# Patient Record
Sex: Male | Born: 1940 | Race: White | Hispanic: No | Marital: Married | Smoking: Former smoker
Health system: Southern US, Community
[De-identification: ages and names within clinical notes are randomized; demographics above are authoritative.]

## PROBLEM LIST (undated history)

## (undated) DIAGNOSIS — Z8679 Personal history of other diseases of the circulatory system: Secondary | ICD-10-CM

## (undated) DIAGNOSIS — C61 Malignant neoplasm of prostate: Secondary | ICD-10-CM

## (undated) DIAGNOSIS — I480 Paroxysmal atrial fibrillation: Secondary | ICD-10-CM

## (undated) DIAGNOSIS — I428 Other cardiomyopathies: Secondary | ICD-10-CM

## (undated) DIAGNOSIS — F0280 Dementia in other diseases classified elsewhere without behavioral disturbance: Secondary | ICD-10-CM

## (undated) DIAGNOSIS — J479 Bronchiectasis, uncomplicated: Secondary | ICD-10-CM

## (undated) DIAGNOSIS — E785 Hyperlipidemia, unspecified: Secondary | ICD-10-CM

## (undated) DIAGNOSIS — J189 Pneumonia, unspecified organism: Secondary | ICD-10-CM

## (undated) DIAGNOSIS — R0600 Dyspnea, unspecified: Secondary | ICD-10-CM

## (undated) DIAGNOSIS — I712 Thoracic aortic aneurysm, without rupture, unspecified: Secondary | ICD-10-CM

## (undated) DIAGNOSIS — F32A Depression, unspecified: Secondary | ICD-10-CM

## (undated) DIAGNOSIS — I5022 Chronic systolic (congestive) heart failure: Secondary | ICD-10-CM

## (undated) DIAGNOSIS — F419 Anxiety disorder, unspecified: Secondary | ICD-10-CM

## (undated) DIAGNOSIS — I429 Cardiomyopathy, unspecified: Secondary | ICD-10-CM

## (undated) DIAGNOSIS — I10 Essential (primary) hypertension: Secondary | ICD-10-CM

## (undated) DIAGNOSIS — I499 Cardiac arrhythmia, unspecified: Secondary | ICD-10-CM

## (undated) HISTORY — DX: Chronic systolic (congestive) heart failure: I50.22

## (undated) HISTORY — DX: Malignant neoplasm of prostate: C61

## (undated) HISTORY — PX: LOBECTOMY: SHX5089

## (undated) HISTORY — PX: KNEE ARTHROSCOPY: SUR90

## (undated) HISTORY — DX: Bronchiectasis, uncomplicated: J47.9

## (undated) HISTORY — DX: Hyperlipidemia, unspecified: E78.5

## (undated) HISTORY — DX: Dementia in other diseases classified elsewhere, unspecified severity, without behavioral disturbance, psychotic disturbance, mood disturbance, and anxiety: F02.80

## (undated) HISTORY — DX: Thoracic aortic aneurysm, without rupture, unspecified: I71.20

## (undated) HISTORY — PX: OTHER SURGICAL HISTORY: SHX169

## (undated) HISTORY — DX: Other cardiomyopathies: I42.8

## (undated) HISTORY — DX: Paroxysmal atrial fibrillation: I48.0

---

## 1998-01-21 ENCOUNTER — Encounter: Admission: RE | Admit: 1998-01-21 | Discharge: 1998-04-21 | Payer: Self-pay | Admitting: Radiation Oncology

## 1998-03-21 ENCOUNTER — Encounter: Payer: Self-pay | Admitting: Urology

## 1998-03-21 ENCOUNTER — Ambulatory Visit (HOSPITAL_BASED_OUTPATIENT_CLINIC_OR_DEPARTMENT_OTHER): Admission: RE | Admit: 1998-03-21 | Discharge: 1998-03-21 | Payer: Self-pay | Admitting: Urology

## 1998-04-11 ENCOUNTER — Encounter: Payer: Self-pay | Admitting: Radiation Oncology

## 1999-03-11 ENCOUNTER — Encounter: Admission: RE | Admit: 1999-03-11 | Discharge: 1999-06-09 | Payer: Self-pay | Admitting: Radiation Oncology

## 2001-08-14 ENCOUNTER — Encounter (INDEPENDENT_AMBULATORY_CARE_PROVIDER_SITE_OTHER): Payer: Self-pay | Admitting: Specialist

## 2001-08-14 ENCOUNTER — Ambulatory Visit (HOSPITAL_COMMUNITY): Admission: RE | Admit: 2001-08-14 | Discharge: 2001-08-14 | Payer: Self-pay | Admitting: Gastroenterology

## 2009-12-12 ENCOUNTER — Ambulatory Visit (HOSPITAL_COMMUNITY)
Admission: RE | Admit: 2009-12-12 | Discharge: 2009-12-12 | Payer: Self-pay | Source: Home / Self Care | Admitting: Urology

## 2010-05-29 NOTE — Op Note (Signed)
NAME:  Justin Contreras, Justin Contreras                           ACCOUNT NO.:  [id number]   MEDICAL RECORD NO.:  [id number]                   PATIENT TYPE:  AMB   LOCATION:  ENDO                                 FACILITY:  [facility]   PHYSICIAN:  Petra Kuba, M.D.                 DATE OF BIRTH:  [birth year]   DATE OF PROCEDURE:  08/14/2001  DATE OF DISCHARGE:                                 OPERATIVE REPORT   PROCEDURE:  Colonoscopy.   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   CONSENT:  Consent was signed after risks, benefits, methods, and options  were thoroughly discussed in the office.   MEDICATIONS USED:  Demerol 100, Versed 10.   DESCRIPTION OF PROCEDURE:  Rectal inspection pertinent for external  hemorrhoids, small. Digital examination was negative. The video regular  colonoscope was inserted, and immediately some localized radiation damage  was seen. This was friable. The scope was then advanced around the colon to  the cecum. Due to a tortuous looping colon, this required rolling him on his  back and some abdominal pressure. Upon insertion a small sigmoid polyp was  seen and was cold biopsied x1, and in the descending and transverse upon  insertion small polyps were seen and we elected to hot biopsy them. The  cecum was identified by the appendiceal orifice and the ileocecal valve. The  scope was slowly withdrawn. There were multiple tiny to small polyps on slow  withdrawal, all of which were hot biopsied and put with the descending and  transverse polyps. Two were in the ascending. Two additionally transversed  and two more in the descending. The polypectomy seen on insertion did not  have any obvious residual polypoid tissue. The prep was adequate. There was  some liquid stool that required washing and suctioning. As we redrew around  the sigmoid the polyp that was cold biopsied was seen, and this was hot  biopsied as well on withdrawal, and that  was put in the first container. No  other polypoid lesions were as we withdrew back to the rectum. The scope was  then retroflexed confirming the radiation proctitis and confirming some  small internal hemorrhoids. Based on the friability and __________ we went  ahead and used the Erbe argon plasma coagulator to the localized area. There  was very minimal radiation telangiectasias around the oozing area, so we  went ahead and used the Erbe until the oozing had stopped with a nice white  coagulum over the area. Bleeding could not be made to bleed with washing.  The scope was re-retroflexed which confirmed the above. The scope was  straightened, readvanced going up to the left side of the colon, air was  suctioned and the scope removed. The patient tolerated the procedure well  and there was no evidence of any complication.   ENDOSCOPIC DIAGNOSES:  1. Internal external  hemorrhoids.  2. Minimal radiation damage, mostly localized with some oozing, status post     Erbe argon plasma coagulator.  3. Multiple tiny small polyps, hot biopsied in all areas except for the     rectum and the cecum. We made addition cold biopsy of one polyp upon     insertion.  4. Otherwise within normal limits to the cecum.    PLAN:  Await pathology to determine future colonic screening. Followup  p.r.n. or in two months, recheck symptoms. Make sure no further aggressive  argon plasma coagulator therapy is needed, for which he is having to use the  suppositories p.r.n.                                               Petra Kuba, M.D.    MEM/MEDQ  D:  08/14/2001  T:  08/17/2001  Job:  [id number]   cc:   Wynn Banker, M.D.   [place] Shelle Iron., M.D.

## 2013-05-01 ENCOUNTER — Other Ambulatory Visit: Payer: Self-pay | Admitting: Urology

## 2013-05-01 DIAGNOSIS — C61 Malignant neoplasm of prostate: Secondary | ICD-10-CM

## 2013-05-07 ENCOUNTER — Encounter (HOSPITAL_COMMUNITY)
Admission: RE | Admit: 2013-05-07 | Discharge: 2013-05-07 | Disposition: A | Discharge status: Home / Self Care | Payer: Medicare Other | Source: Ambulatory Visit | Attending: Urology | Admitting: Urology

## 2013-05-07 DIAGNOSIS — C61 Malignant neoplasm of prostate: Secondary | ICD-10-CM

## 2013-05-07 MED ORDER — TECHNETIUM TC 99M MEDRONATE IV KIT
27.5000 | PACK | Freq: Once | INTRAVENOUS | Status: AC | PRN
  Administered 2013-05-07: 27.5 via INTRAVENOUS

## 2013-05-07 MED ORDER — TECHNETIUM TC 99M MEDRONATE IV KIT: 27.8000 | PACK | Freq: Once | INTRAVENOUS | Status: DC | PRN

## 2013-07-31 ENCOUNTER — Other Ambulatory Visit: Payer: Self-pay | Admitting: Gastroenterology

## 2015-03-07 DIAGNOSIS — H2513 Age-related nuclear cataract, bilateral: Secondary | ICD-10-CM | POA: Insufficient documentation

## 2015-03-07 DIAGNOSIS — R519 Headache, unspecified: Secondary | ICD-10-CM | POA: Insufficient documentation

## 2015-03-07 DIAGNOSIS — G453 Amaurosis fugax: Secondary | ICD-10-CM | POA: Insufficient documentation

## 2015-03-07 DIAGNOSIS — E782 Mixed hyperlipidemia: Secondary | ICD-10-CM | POA: Insufficient documentation

## 2015-03-07 DIAGNOSIS — E785 Hyperlipidemia, unspecified: Secondary | ICD-10-CM | POA: Insufficient documentation

## 2015-03-07 DIAGNOSIS — H524 Presbyopia: Secondary | ICD-10-CM | POA: Insufficient documentation

## 2015-03-07 DIAGNOSIS — H5203 Hypermetropia, bilateral: Secondary | ICD-10-CM | POA: Insufficient documentation

## 2015-03-07 DIAGNOSIS — H52221 Regular astigmatism, right eye: Secondary | ICD-10-CM | POA: Insufficient documentation

## 2016-07-05 DIAGNOSIS — F419 Anxiety disorder, unspecified: Secondary | ICD-10-CM | POA: Insufficient documentation

## 2016-07-05 DIAGNOSIS — Z8546 Personal history of malignant neoplasm of prostate: Secondary | ICD-10-CM | POA: Insufficient documentation

## 2019-06-07 DIAGNOSIS — H43813 Vitreous degeneration, bilateral: Secondary | ICD-10-CM | POA: Insufficient documentation

## 2019-08-22 DIAGNOSIS — M1811 Unilateral primary osteoarthritis of first carpometacarpal joint, right hand: Secondary | ICD-10-CM | POA: Insufficient documentation

## 2020-01-12 DIAGNOSIS — I480 Paroxysmal atrial fibrillation: Secondary | ICD-10-CM

## 2020-01-12 HISTORY — DX: Paroxysmal atrial fibrillation: I48.0

## 2020-03-11 DIAGNOSIS — J441 Chronic obstructive pulmonary disease with (acute) exacerbation: Secondary | ICD-10-CM | POA: Insufficient documentation

## 2020-08-14 ENCOUNTER — Other Ambulatory Visit (HOSPITAL_COMMUNITY): Payer: Self-pay | Admitting: Urology

## 2020-08-14 DIAGNOSIS — R9721 Rising PSA following treatment for malignant neoplasm of prostate: Secondary | ICD-10-CM

## 2020-08-14 DIAGNOSIS — C61 Malignant neoplasm of prostate: Secondary | ICD-10-CM

## 2020-09-01 ENCOUNTER — Other Ambulatory Visit: Payer: Self-pay

## 2020-09-01 ENCOUNTER — Ambulatory Visit (HOSPITAL_COMMUNITY)
Admission: RE | Admit: 2020-09-01 | Discharge: 2020-09-01 | Disposition: A | Discharge status: Home / Self Care | Payer: Medicare Other | Source: Ambulatory Visit | Attending: Urology | Admitting: Urology

## 2020-09-01 DIAGNOSIS — R9721 Rising PSA following treatment for malignant neoplasm of prostate: Secondary | ICD-10-CM | POA: Diagnosis present

## 2020-09-01 DIAGNOSIS — C61 Malignant neoplasm of prostate: Secondary | ICD-10-CM | POA: Diagnosis present

## 2020-09-01 MED ORDER — PIFLIFOLASTAT F 18 (PYLARIFY) INJECTION
9.0000 | Freq: Once | INTRAVENOUS | Status: AC
  Administered 2020-09-01: 9.13 via INTRAVENOUS

## 2020-09-11 ENCOUNTER — Other Ambulatory Visit: Payer: Self-pay | Admitting: Urology

## 2020-09-11 ENCOUNTER — Other Ambulatory Visit (HOSPITAL_COMMUNITY): Payer: Self-pay | Admitting: Urology

## 2020-09-11 DIAGNOSIS — I712 Thoracic aortic aneurysm, without rupture, unspecified: Secondary | ICD-10-CM

## 2020-09-18 ENCOUNTER — Encounter (HOSPITAL_COMMUNITY): Payer: Self-pay

## 2020-09-18 ENCOUNTER — Ambulatory Visit (HOSPITAL_COMMUNITY): Payer: Medicare Other

## 2020-09-19 ENCOUNTER — Other Ambulatory Visit: Payer: Self-pay

## 2020-09-19 ENCOUNTER — Ambulatory Visit (HOSPITAL_COMMUNITY)
Admission: RE | Admit: 2020-09-19 | Discharge: 2020-09-19 | Disposition: A | Discharge status: Home / Self Care | Payer: Medicare Other | Source: Ambulatory Visit | Attending: Urology | Admitting: Urology

## 2020-09-19 DIAGNOSIS — I712 Thoracic aortic aneurysm, without rupture, unspecified: Secondary | ICD-10-CM

## 2020-09-19 MED ORDER — IOHEXOL 350 MG/ML SOLN
80.0000 mL | Freq: Once | INTRAVENOUS | Status: AC | PRN
  Administered 2020-09-19: 80 mL via INTRAVENOUS

## 2020-10-01 ENCOUNTER — Other Ambulatory Visit: Payer: Self-pay

## 2020-10-01 ENCOUNTER — Institutional Professional Consult (permissible substitution) (INDEPENDENT_AMBULATORY_CARE_PROVIDER_SITE_OTHER): Payer: Medicare Other | Admitting: Thoracic Surgery (Cardiothoracic Vascular Surgery)

## 2020-10-01 ENCOUNTER — Encounter: Payer: Self-pay | Admitting: Thoracic Surgery (Cardiothoracic Vascular Surgery)

## 2020-10-01 VITALS — BP 152/76 | HR 81 | Resp 20 | Ht 74.0 in | Wt 175.0 lb

## 2020-10-01 DIAGNOSIS — I712 Thoracic aortic aneurysm, without rupture: Secondary | ICD-10-CM | POA: Diagnosis not present

## 2020-10-01 DIAGNOSIS — I7121 Aneurysm of the ascending aorta, without rupture: Secondary | ICD-10-CM

## 2020-10-01 NOTE — Progress Notes (Signed)
PCP is Derrill Center., MD Referring Provider is Franchot Gallo, MD  Chief Complaint  Patient presents with   Thoracic Aortic Aneurysm    Surgical consult, CTA Chest 09/19/20     HPI: [name] sent for consultation regarding an ascending aneurysm.  Justin Contreras is a 80 year old man with a history of prostate cancer (now with recurrence and left lower lobectomy for bronchiectasis.  He denies any previous cardiac history, heart murmur, hypertension, or hyperlipidemia.  He recently was found to have an elevated PSA.  PET/CT showed recurrence of his prostate cancer.  Also noted a 5 cm ascending aneurysm.  A CT of the chest abdomen pelvis showed the aneurysm was in fact about 5.6 cm in diameter.  The aorta returns to normal caliber just before the left subclavian.  He says has been feeling short of breath dating back to about March or April.  He said decreased energy also complains of weight loss.  In addition he is also had problems with memory recently.  He is very concerned about that.  No chest pain, pressure, or tightness.  Denies peripheral edema.  History reviewed. No pertinent past medical history.  Past Surgical History:  Procedure Laterality Date   KNEE ARTHROSCOPY Left    2014   [facility] micrographic surgery nose     partial remove lung     2008   prostate surgery/transperinal placement of needles      Family History  Problem Relation Age of Onset   Parkinson's disease Mother    Cancer Father    Cancer - Prostate Father     Social History    Current Outpatient Medications  Medication Sig Dispense Refill   albuterol (VENTOLIN HFA) 108 (90 Base) MCG/ACT inhaler Inhale 2 puffs into the lungs every 6 (six) hours as needed. (Patient not taking: Reported on 10/01/2020)     amitriptyline (ELAVIL) 25 MG tablet  (Patient not taking: Reported on 10/01/2020)     citalopram (CELEXA) 20 MG tablet  (Patient not taking: Reported on 10/01/2020)     hyoscyamine (LEVSIN) 0.125 MG tablet  Take by mouth. (Patient not taking: Reported on 10/01/2020)     ibuprofen (ADVIL) 200 MG tablet Take by mouth. (Patient not taking: Reported on 10/01/2020)     meclizine (ANTIVERT) 25 MG tablet Take by mouth. (Patient not taking: Reported on 10/01/2020)     No current facility-administered medications for this visit.    Allergies  Allergen Reactions   Amoxicillin Other (See Comments)    Bad taste in mouth & tongue gets fuzzy feeling   Pravastatin Other (See Comments)    Oral dryness and bad ttaste Bad taste in mouth & tongue gets fuzzy feeling    Simvastatin Other (See Comments)    Oral problems and bad taste Fatigue    Statins     Review of Systems  Constitutional:  Positive for fatigue.  HENT:  Negative for trouble swallowing and voice change.   Respiratory:  Positive for shortness of breath. Negative for wheezing.   Cardiovascular:  Negative for chest pain and leg swelling.  Genitourinary:  Positive for frequency. Negative for dysuria.  Neurological:  Negative for seizures, syncope and weakness.  Hematological:  Negative for adenopathy. Does not bruise/bleed easily.   BP (!) 152/76 (BP Location: Right Arm, Patient Position: Sitting)   Pulse 81   Resp 20   Ht 6\' 2"  (1.88 m)   Wt 175 lb (79.4 kg)   SpO2 94% Comment: RA  BMI 22.47  kg/m  Physical Exam Vitals reviewed.  Constitutional:      General: He is not in acute distress.    Appearance: Normal appearance.  HENT:     Head: Normocephalic and atraumatic.  Eyes:     General: No scleral icterus.    Extraocular Movements: Extraocular movements intact.  Neck:     Vascular: No carotid bruit.  Cardiovascular:     Rate and Rhythm: Normal rate and regular rhythm.     Heart sounds: Murmur (Faint diastolic murmur) heard.    No gallop.  Pulmonary:     Effort: Pulmonary effort is normal. No respiratory distress.     Breath sounds: Normal breath sounds. No wheezing or rales.  Abdominal:     General: There is no distension.      Palpations: Abdomen is soft.  Musculoskeletal:     Cervical back: Neck supple.  Skin:    General: Skin is warm and dry.  Neurological:     General: No focal deficit present.     Mental Status: He is alert and oriented to person, place, and time.     Cranial Nerves: No cranial nerve deficit.     Motor: No weakness.     Diagnostic Tests: CT CHEST FINDINGS   Vascular Findings:   Fusiform aneurysmal dilatation involving the proximal ascending thoracic aorta with measurements as follows. The ascending thoracic aorta remains aneurysmal through the level of the main pulmonary artery though tapers to a normal caliber at the level of the aortic arch.   There is a moderate amount of mixed calcified and noncalcified atherosclerotic plaque involving the aortic arch, not resulting in hemodynamically significant stenosis. The descending thoracic aorta is of normal caliber and widely patent without a hemodynamically significant stenosis.   Review of the precontrast images is negative for the presence an intramural hematoma. No evidence of thoracic aortic dissection or periaortic stranding on this non gated examination.   Conventional configuration of the aortic arch. The branch vessels of the aortic arch appear widely patent throughout their imaged courses.   Cardiomegaly. Coronary artery calcifications. No pericardial effusion though a small amount of fluid is seen within the pericardial recess.   Although this examination was not tailored for the evaluation the pulmonary arteries, there are no discrete filling defects within the central pulmonary arterial tree to suggest central pulmonary embolism. Normal caliber of the main pulmonary artery.   -------------------------------------------------------------   Thoracic aortic measurements:   Sinotubular junction   44 mm as measured in greatest oblique short axis coronal dimension.   Proximal ascending aorta   58 mm as  measured in greatest oblique short axis axial diameter (axial image 192, series 8); 55 mm in greatest oblique short axis coronal diameter (coronal image 50, series 18). The ascending thoracic aorta remains aneurysmal through the level of the main pulmonary artery measuring 15 mm (axial image 165, series 8).   Aortic arch aorta   30 mm as measured in greatest oblique short axis sagittal dimension.   Proximal descending thoracic aorta   31 mm as measured in greatest oblique short axis axial dimension at the level of the main pulmonary artery.   Distal descending thoracic aorta   20 mm as measured in greatest oblique short axis axial dimension at the level of the diaphragmatic hiatus.   Review of the MIP images confirms the above findings.   -------------------------------------------------------------   Non-Vascular Findings:   Mediastinum/Lymph Nodes: No bulky mediastinal, hilar or axillary lymphadenopathy.   Lungs/Pleura: Redemonstrated  pleural calcifications with associated adjacent pleuroparenchymal thickening, most conspicuously involving the right hemidiaphragm (coronal image 78, series 19), likely the sequela previous asbestos exposure. No evidence of fibrosis.   Minimal biapical pleuroparenchymal thickening. No discrete focal airspace opacities. No pleural effusion or pneumothorax. The central pulmonary airways appear widely patent.   Note is made of a approximately 0.4 cm right middle lobe pulmonary nodule (image 92, series 12) as well as an approximately 0.3 cm right lower lobe pulmonary nodule (coronal image 92, series 19).   Note is also made of several scattered perifissural lymph nodes about the right major fissure (images 109 and 125, series 12).   Musculoskeletal: No acute or aggressive osseous abnormalities. 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 Regional soft tissues appear normal. Normal appearance of the thyroid gland.    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  The aorta returns to normal between the takeoff of the left common carotid and the left subclavian arteries.  Impression: Justin Contreras is a 80 year old man with a history of prostate cancer and a left lower lobectomy for bronchiectasis at age 84.  He has been generally healthy prior to his diagnosis of prostate cancer.  No history of hypertension, hyperlipidemia, or atherosclerotic cardiovascular disease.  Recently had a biochemical recurrence of prostate cancer.  A PET/CT was done which confirmed that recurrence.  He also was found to have an ascending aneurysm.  CT of the chest with contrast showed a 5.7 cm ascending aneurysm.  About 4.4 cm at the sinotubular junction.  Ascending aortic aneurysm/thoracic aortic atherosclerosis-I reviewed the images with Mr. and Mrs. Costanzo.  We discussed indication for surgery.  Generally speaking when the aorta is greater than 5.5 cm the risk of dissection and/or rupture increases dramatically and there is indication for surgery.  He would need further work-up prior to surgical repair.  Timing of intervention is complicated in his case due to his recurrent prostate cancer.  Recurrent prostate cancer.  Seeing Dr. Diona Fanti.  Patient says he was told he might be a candidate for cryoablation and if so they can proceed with that without any issue from my standpoint.  A little more complicated if he needed any major surgical resection.  No history of hypertension.  Blood pressure slightly elevated today.  Not unusual first time seeing a Psychologist, sport and exercise.  We will recheck him when he returns in a couple of weeks.  Likely will need medication.  He does have a faint diastolic murmur.  I would be surprised if he has some aortic insufficiency.   We will get a 2D echo to assess that.  He does need cardiac catheterization to further assess his coronary disease prior to possible surgical repair.  Plan: 2D echo to assess aortic valve and LV function Cardiology consultation for catheterization Return in 2 to 3 weeks to discuss results and decide on whether to proceed with surgical resection.  Melrose Nakayama, MD [facility] [phone]

## 2020-10-02 ENCOUNTER — Other Ambulatory Visit: Payer: Self-pay | Admitting: *Deleted

## 2020-10-02 DIAGNOSIS — Z01818 Encounter for other preprocedural examination: Secondary | ICD-10-CM

## 2020-10-02 DIAGNOSIS — R0600 Dyspnea, unspecified: Secondary | ICD-10-CM

## 2020-10-02 DIAGNOSIS — R011 Cardiac murmur, unspecified: Secondary | ICD-10-CM

## 2020-10-06 ENCOUNTER — Ambulatory Visit (HOSPITAL_BASED_OUTPATIENT_CLINIC_OR_DEPARTMENT_OTHER)
Admission: RE | Admit: 2020-10-06 | Discharge: 2020-10-06 | Disposition: A | Discharge status: Home / Self Care | Payer: Medicare Other | Source: Ambulatory Visit | Attending: Thoracic Surgery (Cardiothoracic Vascular Surgery) | Admitting: Thoracic Surgery (Cardiothoracic Vascular Surgery)

## 2020-10-06 ENCOUNTER — Other Ambulatory Visit: Payer: Self-pay

## 2020-10-06 DIAGNOSIS — R0609 Other forms of dyspnea: Secondary | ICD-10-CM

## 2020-10-06 DIAGNOSIS — Z01818 Encounter for other preprocedural examination: Secondary | ICD-10-CM | POA: Insufficient documentation

## 2020-10-06 DIAGNOSIS — R0600 Dyspnea, unspecified: Secondary | ICD-10-CM | POA: Insufficient documentation

## 2020-10-06 DIAGNOSIS — R011 Cardiac murmur, unspecified: Secondary | ICD-10-CM | POA: Diagnosis present

## 2020-10-06 LAB — ECHOCARDIOGRAM COMPLETE
AR max vel: 3.27 cm2
AV Area VTI: 2.94 cm2
AV Area mean vel: 3.31 cm2
AV Mean grad: 6 mmHg
AV Peak grad: 11.6 mmHg
Ao pk vel: 1.7 m/s
Area-P 1/2: 2 cm2
Calc EF: 48.5 %
P 1/2 time: 578 msec
S' Lateral: 4.3 cm
Single Plane A2C EF: 52 %
Single Plane A4C EF: 46.8 %

## 2020-10-09 ENCOUNTER — Telehealth: Payer: Self-pay

## 2020-10-09 NOTE — Telephone Encounter (Signed)
Patient contacted the office confused about future appointments. He was unsure if he should keep new appointment with Cardiology office because of the drive from [facility]. He stated that "this is the soonest they could get me in". He wasn't sure that he would be able to continue follow-up after surgery with Cardiology in [place]. Advised that he could come to [facility] for future appointments after surgery. Patient seemed somewhat better and like he understood but still confused and spoke extremely slow. He seemed like he knew what he wanted to say but could not get the words out. It was mentioned in his note with Dr. Roxan Hockey that he was concerned about memory loss. Patient's wife was not available at home when patient was called.

## 2020-10-16 ENCOUNTER — Encounter: Payer: Self-pay | Admitting: Internal Medicine

## 2020-10-16 ENCOUNTER — Other Ambulatory Visit: Payer: Self-pay

## 2020-10-16 ENCOUNTER — Ambulatory Visit (INDEPENDENT_AMBULATORY_CARE_PROVIDER_SITE_OTHER): Payer: Medicare Other | Admitting: Internal Medicine

## 2020-10-16 VITALS — BP 160/68 | HR 93 | Ht 74.0 in | Wt 154.5 lb

## 2020-10-16 DIAGNOSIS — E785 Hyperlipidemia, unspecified: Secondary | ICD-10-CM | POA: Diagnosis not present

## 2020-10-16 DIAGNOSIS — I719 Aortic aneurysm of unspecified site, without rupture: Secondary | ICD-10-CM

## 2020-10-16 DIAGNOSIS — I5021 Acute systolic (congestive) heart failure: Secondary | ICD-10-CM

## 2020-10-16 DIAGNOSIS — C61 Malignant neoplasm of prostate: Secondary | ICD-10-CM

## 2020-10-16 MED ORDER — CARVEDILOL 3.125 MG PO TABS: 3.1250 mg | ORAL_TABLET | Freq: Two times a day (BID) | ORAL | 0 refills | Status: DC

## 2020-10-16 MED ORDER — LISINOPRIL 5 MG PO TABS: 5.0000 mg | ORAL_TABLET | Freq: Every day | ORAL | 0 refills | Status: DC

## 2020-10-16 MED ORDER — ASPIRIN EC 81 MG PO TBEC: 81.0000 mg | DELAYED_RELEASE_TABLET | Freq: Every day | ORAL | 0 refills | Status: DC

## 2020-10-16 NOTE — Patient Instructions (Signed)
Medication Instructions:   Your physician has recommended you make the following change in your medication:    START taking Aspirin 81 MG.  2.    START taking Carvedilol (Coreg) 3.25 twice a day.  3.    START taking Lisinopril 5 MG once a day.  *If you need a refill on your cardiac medications before your next appointment, please call your pharmacy*   Lab Work:  CBC, BMP to be drawn today.  Testing/Procedures:   You are scheduled for a Cardiac Catheterization on Monday, October 10 with Dr. Harrell Gave End.  1. Please arrive at the [facility] (Main Entrance A) at [facility]: [place], [place] [address] at 5:30 AM (This time is two hours before your procedure to ensure your preparation). Free valet parking service is available.   Special note: Every effort is made to have your procedure done on time. Please understand that emergencies sometimes delay scheduled procedures.  2. Diet: Do not eat solid foods after midnight.  The patient may have clear liquids until 5am upon the day of the procedure.  3. Labs: Drawn in office  4. Medication instructions in preparation for your procedure:   Contrast Allergy: No   On the morning of your procedure, take your Aspirin and any morning medicines NOT listed above.  You may use sips of water.  5. Plan for one night stay--bring personal belongings. 6. Bring a current list of your medications and current insurance cards. 7. You MUST have a responsible person to drive you home. 8. Someone MUST be with you the first 24 hours after you arrive home or your discharge will be delayed. 9. Please wear clothes that are easy to get on and off and wear slip-on shoes.  Thank you for allowing [place] to care for you!   -- [facility]    Follow-Up: At [facility], you and your health needs are our priority.  As part of our continuing mission to provide you with exceptional heart care, we have  created designated Provider Care Teams.  These Care Teams include your primary Cardiologist (physician) and Advanced Practice Providers (APPs -  Physician Assistants and Nurse Practitioners) who all work together to provide you with the care you need, when you need it.  We recommend signing up for the patient portal called "MyChart".  Sign up information is provided on this After Visit Summary.  MyChart is used to connect with patients for Virtual Visits (Telemedicine).  Patients are able to view lab/test results, encounter notes, upcoming appointments, etc.  Non-urgent messages can be sent to your provider as well.   To learn more about what you can do with MyChart, go to [email or web address].    Your next appointment:   2 week(s)  The format for your next appointment:   In Person  Provider:   You may see Dr. Saunders Revel  or one of the following Advanced Practice Providers on your designated Care Team:   Murray Hodgkins, NP Christell Faith, PA-C Marrianne Mood, PA-C Cadence Kathlen Mody, [place]   Other Instructions

## 2020-10-16 NOTE — Progress Notes (Signed)
New Outpatient Visit Date: 10/16/2020  Referring Provider: Melrose Nakayama, MD [address] [place],  [place] [address]  Chief Complaint: Dyspnea on exertion and thoracic aortic aneurysm  HPI:  Justin Contreras is a 80 y.o. male who is being seen today for the evaluation of dyspnea on exertion and thoracic aortic aneurysm at the request of Dr. Roxan Hockey. He has a history of recurrent prostate cancer followed by Dr. Diona Fanti as well as recently diagnosed thoracic aortic aneurysm, hyperlipidemia (statin intolerant), and recurrent lung infections attributed to bronchiectasis status post remote left lung resection.  Follow-up of his prostate cancer recently revealed elevated PSA, prompting referral for PET/CT.  This demonstrated a 5 cm ascending aortic aneurysm, with follow-up CTA showing aneurysm measuring up to 5.6 cm in diameter.  He was referred to Dr. Roxan Hockey for consideration of surgical repair.  Subsequent echo demonstrated mildly-moderately reduced left ventricular systolic function (EF 93%) with mild LVH and global hypokinesis.  Mild mitral and aortic valve regurgitation were noted.  Ascending aorta and aortic root were again noted to be severely enlarged.  Today, Mr. Justin Contreras reports that he has been having shortness of breath with modest activity over the last few months.  He also feels like he has been more forgetful over the last 4 months, though his family has noted some memory issues for the last 1.5 years.  He denies chest pain, palpitations, lightheadedness, edema, and orthopnea.  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  Mildly-moderately reduced systolic function with LVEF of 45%.  There is global hypokinesis with grade 1 diastolic dysfunction.  Normal RV size and function.  Normal biatrial size.  Mild mitral and aortic valve regurgitation.  Severely dilated aortic root and ascending aorta, measuring 5.5 cm.  Elevated central venous pressure. CTA chest, abdomen, and pelvis (09/19/2020): Fusiform aneurysmal dilation of the ascending aorta measuring up to 5.8 cm, coronary artery calcification, pleural calcifications, and indeterminate punctate right-sided pulmonary nodules.  Aortic atherosclerosis, indeterminate hypoattenuating lesion in the tail of the pancreas, and suspected localized recurrence of cancer in the prostate bed.  --------------------------------------------------------------------------------------------------  Past Medical History:  Diagnosis Date   Prostate cancer [facility])     Past Surgical History:  Procedure Laterality Date   KNEE ARTHROSCOPY Left    2014   mohs micrographic surgery nose     partial remove lung     2008   prostate surgery/transperinal placement of needles      Current Meds  Medication Sig   aspirin EC 81 MG tablet Take 1 tablet (81 mg total) by mouth daily. Swallow whole.   carvedilol (COREG) 3.125 MG tablet Take 1 tablet (3.125 mg total) by mouth 2 (two) times daily.   lisinopril (ZESTRIL) 5 MG tablet Take 1 tablet (5 mg total) by mouth daily.    Allergies: Amoxicillin, Pravastatin, Simvastatin, and Statins  Social History   Tobacco Use   Smoking status: Former    Years: 10.00    Types: Cigarettes   Smokeless tobacco: Never  Vaping Use   Vaping Use: Never used  Substance Use Topics   Alcohol use: Yes    Alcohol/week: 1.0 standard drink  Types: 1 Glasses of wine  per week   Drug use: Never    Family History  Problem Relation Age of Onset   Parkinson's disease Mother    Cancer Father    Cancer - Prostate Father     Review of Systems: A 12-system review of systems was performed and was negative except as noted in the HPI.  --------------------------------------------------------------------------------------------------  Physical Exam: BP (!) 160/68 (BP Location: Right Arm, Patient Position: Sitting, Cuff Size: Normal)   Pulse 93   Ht 6\' 2"  (1.88 m)   Wt 154 lb 8 oz (70.1 kg)   SpO2 97%   BMI 19.84 kg/m   General: NAD.  Accompanied by wife and son. HEENT: No conjunctival pallor or scleral icterus. Facemask in place. Neck: Supple without lymphadenopathy, thyromegaly, JVD, or HJR. No carotid bruit. Lungs: Normal work of breathing.  Mildly diminished breath sounds throughout without wheezes or crackles. Heart: Regular rate and rhythm with 1/6 systolic and early diastolic murmurs.  No rubs or gallops.  Nondisplaced PMI. Abd: Bowel sounds present. Soft, NT/ND without hepatosplenomegaly Ext: No lower extremity edema. Radial, PT, and DP pulses are 2+ bilaterally Skin: Warm and dry without rash. Neuro: CNIII-XII intact. Strength and fine-touch sensation intact in upper and lower extremities bilaterally. Psych: Normal mood and affect.  EKG: Normal sinus rhythm with first-degree AV block (PR interval 224 ms) and left bundle branch block.  No prior tracing available for comparison.  Lab Results  Component Value Date   WBC 5.1 10/16/2020   HGB 15.2 10/16/2020   HCT 45.2 10/16/2020   MCV 90 10/16/2020   PLT 177 10/16/2020    Lab Results  Component Value Date   NA 142 10/16/2020   K 4.2 10/16/2020   CL 104 10/16/2020   CO2 21 10/16/2020   BUN 20 10/16/2020   CREATININE 1.15 10/16/2020   GLUCOSE 133 (H) 10/16/2020   --------------------------------------------------------------------------------------------------  ASSESSMENT AND  PLAN: Thoracic aortic aneurysm: Incidentally noted during work-up of recurrent prostate cancer.  Thoracic aorta is severely dilated and at elevated risk for dissection/rupture.  This will likely need to be repaired in the near future.  We will move forward with right and left heart catheterization next week to further evaluate cardiomyopathy and exertional dyspnea as well as to assist with surgical planning for TAA repair.  We will add carvedilol and lisinopril, as detailed below, as well as aspirin 81 mg daily.  Acute HFrEF: Mr. Schumpert reports fairly recent onset of exertional dyspnea and was noted to have mildly-moderately reduced LVEF on recent echo.  He appears euvolemic on exam with NYHA class III symptoms.  His degree of aortic and mitral regurgitation would not explain his cardiomyopathy.  We have agreed to proceed with right and left heart catheterization, as outlined above.  Even if significant underlying CAD is identified, we would defer PCI given need for TAA repair and potential for bypass to be performed at the time of surgery.  In the meantime, we will add carvedilol 3.125 mg twice daily (PR interval will need to be followed closely) and lisinopril 2.5 mg daily.  Hypertension: Blood pressure moderately elevated today.  Elevated blood pressure was also noted during recent visit with Dr. Roxan Hockey.  Though Mr. Talarico does not carry a diagnosis of hypertension, certainly his readings are consistent with this diagnosis.  In the setting of cardiomyopathy and TAA, aggressive blood pressure control is warranted.  As above, we will start with low-dose carvedilol and lisinopril, with further up titration  as needed.  Hyperlipidemia: No recent lipid panel available.  We will plan to draw fasting lipid panel at the time of catheterization next week.  Based on results, we may need to rechallenge him with a statin (intolerant of pravastatin and simvastatin) or consider an alternative agent such as PCSK9  inhibitor or bempedoic acid.  Prostate cancer: Ongoing work-up per Dr. Diona Fanti.  Mr. Demeo reports that he may need to undergo cryoablation versus hormonal therapy.  We will defer further recommendations regarding perioperative risk until after catheterization and further consultation with Dr. Roxan Hockey.  Shared Decision Making/Informed Consent The risks [stroke (1 in 1000), death (1 in 1000), kidney failure [usually temporary] (1 in 500), bleeding (1 in 200), allergic reaction [possibly serious] (1 in 200)], benefits (diagnostic support and management of coronary artery disease) and alternatives of a cardiac catheterization were discussed in detail with Mr. Powley and he is willing to proceed.  Follow-up: Return to clinic in 2-3 weeks.  Nelva Bush, MD 10/17/2020 7:37 AM

## 2020-10-16 NOTE — H&P (View-Only) (Signed)
New Outpatient Visit Date: 10/16/2020  Referring Provider: Melrose Nakayama, MD [address] [place],  [place] [address]  Chief Complaint: Dyspnea on exertion and thoracic aortic aneurysm  HPI:  Justin Contreras is a 80 y.o. male who is being seen today for the evaluation of dyspnea on exertion and thoracic aortic aneurysm at the request of Dr. Roxan Hockey. He has a history of recurrent prostate cancer followed by Dr. Diona Fanti as well as recently diagnosed thoracic aortic aneurysm, hyperlipidemia (statin intolerant), and recurrent lung infections attributed to bronchiectasis status post remote left lung resection.  Follow-up of his prostate cancer recently revealed elevated PSA, prompting referral for PET/CT.  This demonstrated a 5 cm ascending aortic aneurysm, with follow-up CTA showing aneurysm measuring up to 5.6 cm in diameter.  He was referred to Dr. Roxan Hockey for consideration of surgical repair.  Subsequent echo demonstrated mildly-moderately reduced left ventricular systolic function (EF 91%) with mild LVH and global hypokinesis.  Mild mitral and aortic valve regurgitation were noted.  Ascending aorta and aortic root were again noted to be severely enlarged.  Today, Justin Contreras reports that he has been having shortness of breath with modest activity over the last few months.  He also feels like he has been more forgetful over the last 4 months, though his family has noted some memory issues for the last 1.5 years.  He denies chest pain, palpitations, lightheadedness, edema, and orthopnea.  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  Mildly-moderately reduced systolic function with LVEF of 45%.  There is global hypokinesis with grade 1 diastolic dysfunction.  Normal RV size and function.  Normal biatrial size.  Mild mitral and aortic valve regurgitation.  Severely dilated aortic root and ascending aorta, measuring 5.5 cm.  Elevated central venous pressure. CTA chest, abdomen, and pelvis (09/19/2020): Fusiform aneurysmal dilation of the ascending aorta measuring up to 5.8 cm, coronary artery calcification, pleural calcifications, and indeterminate punctate right-sided pulmonary nodules.  Aortic atherosclerosis, indeterminate hypoattenuating lesion in the tail of the pancreas, and suspected localized recurrence of cancer in the prostate bed.  --------------------------------------------------------------------------------------------------  Past Medical History:  Diagnosis Date   Prostate cancer [facility])     Past Surgical History:  Procedure Laterality Date   KNEE ARTHROSCOPY Left    2014   mohs micrographic surgery nose     partial remove lung     2008   prostate surgery/transperinal placement of needles      Current Meds  Medication Sig   aspirin EC 81 MG tablet Take 1 tablet (81 mg total) by mouth daily. Swallow whole.   carvedilol (COREG) 3.125 MG tablet Take 1 tablet (3.125 mg total) by mouth 2 (two) times daily.   lisinopril (ZESTRIL) 5 MG tablet Take 1 tablet (5 mg total) by mouth daily.    Allergies: Amoxicillin, Pravastatin, Simvastatin, and Statins  Social History   Tobacco Use   Smoking status: Former    Years: 10.00    Types: Cigarettes   Smokeless tobacco: Never  Vaping Use   Vaping Use: Never used  Substance Use Topics   Alcohol use: Yes    Alcohol/week: 1.0 standard drink  Types: 1 Glasses of wine  per week   Drug use: Never    Family History  Problem Relation Age of Onset   Parkinson's disease Mother    Cancer Father    Cancer - Prostate Father     Review of Systems: A 12-system review of systems was performed and was negative except as noted in the HPI.  --------------------------------------------------------------------------------------------------  Physical Exam: BP (!) 160/68 (BP Location: Right Arm, Patient Position: Sitting, Cuff Size: Normal)   Pulse 93   Ht 6\' 2"  (1.88 m)   Wt 154 lb 8 oz (70.1 kg)   SpO2 97%   BMI 19.84 kg/m   General: NAD.  Accompanied by wife and son. HEENT: No conjunctival pallor or scleral icterus. Facemask in place. Neck: Supple without lymphadenopathy, thyromegaly, JVD, or HJR. No carotid bruit. Lungs: Normal work of breathing.  Mildly diminished breath sounds throughout without wheezes or crackles. Heart: Regular rate and rhythm with 1/6 systolic and early diastolic murmurs.  No rubs or gallops.  Nondisplaced PMI. Abd: Bowel sounds present. Soft, NT/ND without hepatosplenomegaly Ext: No lower extremity edema. Radial, PT, and DP pulses are 2+ bilaterally Skin: Warm and dry without rash. Neuro: CNIII-XII intact. Strength and fine-touch sensation intact in upper and lower extremities bilaterally. Psych: Normal mood and affect.  EKG: Normal sinus rhythm with first-degree AV block (PR interval 224 ms) and left bundle branch block.  No prior tracing available for comparison.  Lab Results  Component Value Date   WBC 5.1 10/16/2020   HGB 15.2 10/16/2020   HCT 45.2 10/16/2020   MCV 90 10/16/2020   PLT 177 10/16/2020    Lab Results  Component Value Date   NA 142 10/16/2020   K 4.2 10/16/2020   CL 104 10/16/2020   CO2 21 10/16/2020   BUN 20 10/16/2020   CREATININE 1.15 10/16/2020   GLUCOSE 133 (H) 10/16/2020   --------------------------------------------------------------------------------------------------  ASSESSMENT AND  PLAN: Thoracic aortic aneurysm: Incidentally noted during work-up of recurrent prostate cancer.  Thoracic aorta is severely dilated and at elevated risk for dissection/rupture.  This will likely need to be repaired in the near future.  We will move forward with right and left heart catheterization next week to further evaluate cardiomyopathy and exertional dyspnea as well as to assist with surgical planning for TAA repair.  We will add carvedilol and lisinopril, as detailed below, as well as aspirin 81 mg daily.  Acute HFrEF: Justin Contreras reports fairly recent onset of exertional dyspnea and was noted to have mildly-moderately reduced LVEF on recent echo.  He appears euvolemic on exam with NYHA class III symptoms.  His degree of aortic and mitral regurgitation would not explain his cardiomyopathy.  We have agreed to proceed with right and left heart catheterization, as outlined above.  Even if significant underlying CAD is identified, we would defer PCI given need for TAA repair and potential for bypass to be performed at the time of surgery.  In the meantime, we will add carvedilol 3.125 mg twice daily (PR interval will need to be followed closely) and lisinopril 2.5 mg daily.  Hypertension: Blood pressure moderately elevated today.  Elevated blood pressure was also noted during recent visit with Dr. Roxan Hockey.  Though Justin Contreras does not carry a diagnosis of hypertension, certainly his readings are consistent with this diagnosis.  In the setting of cardiomyopathy and TAA, aggressive blood pressure control is warranted.  As above, we will start with low-dose carvedilol and lisinopril, with further up titration  as needed.  Hyperlipidemia: No recent lipid panel available.  We will plan to draw fasting lipid panel at the time of catheterization next week.  Based on results, we may need to rechallenge him with a statin (intolerant of pravastatin and simvastatin) or consider an alternative agent such as PCSK9  inhibitor or bempedoic acid.  Prostate cancer: Ongoing work-up per Dr. Diona Fanti.  Justin Contreras reports that he may need to undergo cryoablation versus hormonal therapy.  We will defer further recommendations regarding perioperative risk until after catheterization and further consultation with Dr. Roxan Hockey.  Shared Decision Making/Informed Consent The risks [stroke (1 in 1000), death (1 in 1000), kidney failure [usually temporary] (1 in 500), bleeding (1 in 200), allergic reaction [possibly serious] (1 in 200)], benefits (diagnostic support and management of coronary artery disease) and alternatives of a cardiac catheterization were discussed in detail with Justin Contreras and he is willing to proceed.  Follow-up: Return to clinic in 2-3 weeks.  Nelva Bush, MD 10/17/2020 7:37 AM

## 2020-10-17 ENCOUNTER — Encounter: Payer: Self-pay | Admitting: Internal Medicine

## 2020-10-17 DIAGNOSIS — C61 Malignant neoplasm of prostate: Secondary | ICD-10-CM | POA: Insufficient documentation

## 2020-10-17 DIAGNOSIS — I719 Aortic aneurysm of unspecified site, without rupture: Secondary | ICD-10-CM | POA: Insufficient documentation

## 2020-10-17 DIAGNOSIS — I5021 Acute systolic (congestive) heart failure: Secondary | ICD-10-CM | POA: Insufficient documentation

## 2020-10-17 LAB — BASIC METABOLIC PANEL
BUN/Creatinine Ratio: 17 (ref 10–24)
BUN: 20 mg/dL (ref 8–27)
CO2: 21 mmol/L (ref 20–29)
Calcium: 9.2 mg/dL (ref 8.6–10.2)
Chloride: 104 mmol/L (ref 96–106)
Creatinine, Ser: 1.15 mg/dL (ref 0.76–1.27)
Glucose: 133 mg/dL — ABNORMAL HIGH (ref 70–99)
Potassium: 4.2 mmol/L (ref 3.5–5.2)
Sodium: 142 mmol/L (ref 134–144)
eGFR: 65 mL/min/{1.73_m2} (ref >59)

## 2020-10-17 LAB — CBC
Hematocrit: 45.2 % (ref 37.5–51.0)
Hemoglobin: 15.2 g/dL (ref 13.0–17.7)
MCH: 30.2 pg (ref 26.6–33.0)
MCHC: 33.6 g/dL (ref 31.5–35.7)
MCV: 90 fL (ref 79–97)
Platelets: 177 10*3/uL (ref 150–450)
RBC: 5.03 x10E6/uL (ref 4.14–5.80)
RDW: 13.2 % (ref 11.6–15.4)
WBC: 5.1 10*3/uL (ref 3.4–10.8)

## 2020-10-20 ENCOUNTER — Encounter (HOSPITAL_COMMUNITY)
Admission: RE | Disposition: A | Discharge status: Home / Self Care | Payer: Self-pay | Source: Ambulatory Visit | Attending: Internal Medicine

## 2020-10-20 ENCOUNTER — Ambulatory Visit (HOSPITAL_COMMUNITY)
Admission: RE | Admit: 2020-10-20 | Discharge: 2020-10-20 | Disposition: A | Discharge status: Home / Self Care | Payer: Medicare Other | Source: Ambulatory Visit | Attending: Internal Medicine | Admitting: Internal Medicine

## 2020-10-20 ENCOUNTER — Encounter (HOSPITAL_COMMUNITY): Payer: Self-pay | Admitting: Radiology

## 2020-10-20 ENCOUNTER — Encounter (HOSPITAL_COMMUNITY): Payer: Self-pay | Admitting: Internal Medicine

## 2020-10-20 ENCOUNTER — Other Ambulatory Visit: Payer: Self-pay

## 2020-10-20 DIAGNOSIS — C61 Malignant neoplasm of prostate: Secondary | ICD-10-CM | POA: Diagnosis not present

## 2020-10-20 DIAGNOSIS — Z87891 Personal history of nicotine dependence: Secondary | ICD-10-CM | POA: Diagnosis not present

## 2020-10-20 DIAGNOSIS — I340 Nonrheumatic mitral (valve) insufficiency: Secondary | ICD-10-CM | POA: Insufficient documentation

## 2020-10-20 DIAGNOSIS — Z79899 Other long term (current) drug therapy: Secondary | ICD-10-CM | POA: Insufficient documentation

## 2020-10-20 DIAGNOSIS — I110 Hypertensive heart disease with heart failure: Secondary | ICD-10-CM | POA: Diagnosis not present

## 2020-10-20 DIAGNOSIS — I7121 Aneurysm of the ascending aorta, without rupture: Secondary | ICD-10-CM | POA: Diagnosis not present

## 2020-10-20 DIAGNOSIS — I712 Thoracic aortic aneurysm, without rupture, unspecified: Secondary | ICD-10-CM | POA: Diagnosis not present

## 2020-10-20 DIAGNOSIS — I719 Aortic aneurysm of unspecified site, without rupture: Secondary | ICD-10-CM

## 2020-10-20 DIAGNOSIS — E785 Hyperlipidemia, unspecified: Secondary | ICD-10-CM | POA: Diagnosis not present

## 2020-10-20 DIAGNOSIS — I429 Cardiomyopathy, unspecified: Secondary | ICD-10-CM | POA: Insufficient documentation

## 2020-10-20 DIAGNOSIS — R0609 Other forms of dyspnea: Secondary | ICD-10-CM | POA: Insufficient documentation

## 2020-10-20 DIAGNOSIS — Z7982 Long term (current) use of aspirin: Secondary | ICD-10-CM | POA: Diagnosis not present

## 2020-10-20 DIAGNOSIS — I5021 Acute systolic (congestive) heart failure: Secondary | ICD-10-CM | POA: Insufficient documentation

## 2020-10-20 DIAGNOSIS — Z888 Allergy status to other drugs, medicaments and biological substances status: Secondary | ICD-10-CM | POA: Diagnosis not present

## 2020-10-20 DIAGNOSIS — Z880 Allergy status to penicillin: Secondary | ICD-10-CM | POA: Diagnosis not present

## 2020-10-20 HISTORY — PX: RIGHT/LEFT HEART CATH AND CORONARY ANGIOGRAPHY: CATH118266

## 2020-10-20 LAB — POCT I-STAT 7, (LYTES, BLD GAS, ICA,H+H)
Acid-Base Excess: 2 mmol/L (ref 0.0–2.0)
Bicarbonate: 27.5 mmol/L (ref 20.0–28.0)
Calcium, Ion: 1.23 mmol/L (ref 1.15–1.40)
HCT: 39 % (ref 39.0–52.0)
Hemoglobin: 13.3 g/dL (ref 13.0–17.0)
O2 Saturation: 96 %
Potassium: 3.9 mmol/L (ref 3.5–5.1)
Sodium: 143 mmol/L (ref 135–145)
TCO2: 29 mmol/L (ref 22–32)
pCO2 arterial: 45.3 mmHg (ref 32.0–48.0)
pH, Arterial: 7.392 (ref 7.350–7.450)
pO2, Arterial: 80 mmHg — ABNORMAL LOW (ref 83.0–108.0)

## 2020-10-20 LAB — LIPID PANEL
Cholesterol: 163 mg/dL (ref 0–200)
HDL: 58 mg/dL (ref >40)
LDL Cholesterol: 96 mg/dL (ref 0–99)
Total CHOL/HDL Ratio: 2.8 RATIO
Triglycerides: 44 mg/dL (ref <150)
VLDL: 9 mg/dL (ref 0–40)

## 2020-10-20 LAB — POCT I-STAT EG7
Acid-Base Excess: 2 mmol/L (ref 0.0–2.0)
Bicarbonate: 28.7 mmol/L — ABNORMAL HIGH (ref 20.0–28.0)
Calcium, Ion: 1.2 mmol/L (ref 1.15–1.40)
HCT: 39 % (ref 39.0–52.0)
Hemoglobin: 13.3 g/dL (ref 13.0–17.0)
O2 Saturation: 71 %
Potassium: 3.9 mmol/L (ref 3.5–5.1)
Sodium: 144 mmol/L (ref 135–145)
TCO2: 30 mmol/L (ref 22–32)
pCO2, Ven: 49.7 mmHg (ref 44.0–60.0)
pH, Ven: 7.369 (ref 7.250–7.430)
pO2, Ven: 39 mmHg (ref 32.0–45.0)

## 2020-10-20 LAB — ALT: ALT: 34 U/L (ref 0–44)

## 2020-10-20 SURGERY — RIGHT/LEFT HEART CATH AND CORONARY ANGIOGRAPHY
Anesthesia: LOCAL

## 2020-10-20 MED ORDER — SODIUM CHLORIDE 0.9% FLUSH: 3.0000 mL | Freq: Two times a day (BID) | INTRAVENOUS | Status: DC

## 2020-10-20 MED ORDER — HEPARIN SODIUM (PORCINE) 1000 UNIT/ML IJ SOLN
INTRAMUSCULAR | Status: DC | PRN
  Administered 2020-10-20: 3500 [IU] via INTRAVENOUS

## 2020-10-20 MED ORDER — LABETALOL HCL 5 MG/ML IV SOLN: 10.0000 mg | INTRAVENOUS | Status: DC | PRN

## 2020-10-20 MED ORDER — VERAPAMIL HCL 2.5 MG/ML IV SOLN
INTRAVENOUS | Status: DC | PRN
  Administered 2020-10-20: 10 mL via INTRA_ARTERIAL

## 2020-10-20 MED ORDER — ACETAMINOPHEN 325 MG PO TABS: 650.0000 mg | ORAL_TABLET | ORAL | Status: DC | PRN

## 2020-10-20 MED ORDER — HEPARIN (PORCINE) IN NACL 1000-0.9 UT/500ML-% IV SOLN
INTRAVENOUS | Status: DC | PRN
  Administered 2020-10-20 (×2): 500 mL

## 2020-10-20 MED ORDER — ONDANSETRON HCL 4 MG/2ML IJ SOLN: 4.0000 mg | Freq: Four times a day (QID) | INTRAMUSCULAR | Status: DC | PRN

## 2020-10-20 MED ORDER — MIDAZOLAM HCL 2 MG/2ML IJ SOLN
INTRAMUSCULAR | Status: DC | PRN
  Administered 2020-10-20: .5 mg via INTRAVENOUS

## 2020-10-20 MED ORDER — HYDRALAZINE HCL 20 MG/ML IJ SOLN: 10.0000 mg | INTRAMUSCULAR | Status: DC | PRN

## 2020-10-20 MED ORDER — ASPIRIN 81 MG PO CHEW: 81.0000 mg | CHEWABLE_TABLET | ORAL | Status: DC

## 2020-10-20 MED ORDER — SODIUM CHLORIDE 0.9% FLUSH: 3.0000 mL | INTRAVENOUS | Status: DC | PRN

## 2020-10-20 MED ORDER — SODIUM CHLORIDE 0.9 % IV SOLN: 250.0000 mL | INTRAVENOUS | Status: DC | PRN

## 2020-10-20 MED ORDER — IOHEXOL 350 MG/ML SOLN
INTRAVENOUS | Status: DC | PRN
  Administered 2020-10-20: 50 mL via INTRA_ARTERIAL

## 2020-10-20 MED ORDER — SODIUM CHLORIDE 0.9 % IV SOLN: INTRAVENOUS | Status: DC

## 2020-10-20 MED ORDER — FENTANYL CITRATE (PF) 100 MCG/2ML IJ SOLN
INTRAMUSCULAR | Status: DC | PRN
  Administered 2020-10-20: 12.5 ug via INTRAVENOUS

## 2020-10-20 MED ORDER — VERAPAMIL HCL 2.5 MG/ML IV SOLN
INTRAVENOUS | Status: AC
  Filled 2020-10-20: qty 2

## 2020-10-20 MED ORDER — LIDOCAINE HCL (PF) 1 % IJ SOLN
INTRAMUSCULAR | Status: AC
  Filled 2020-10-20: qty 30

## 2020-10-20 MED ORDER — HEPARIN SODIUM (PORCINE) 1000 UNIT/ML IJ SOLN
INTRAMUSCULAR | Status: AC
  Filled 2020-10-20: qty 1

## 2020-10-20 MED ORDER — LIDOCAINE HCL (PF) 1 % IJ SOLN
INTRAMUSCULAR | Status: DC | PRN
  Administered 2020-10-20 (×2): 2 mL

## 2020-10-20 MED ORDER — FENTANYL CITRATE (PF) 100 MCG/2ML IJ SOLN
INTRAMUSCULAR | Status: AC
  Filled 2020-10-20: qty 2

## 2020-10-20 MED ORDER — HEPARIN (PORCINE) IN NACL 1000-0.9 UT/500ML-% IV SOLN
INTRAVENOUS | Status: AC
  Filled 2020-10-20: qty 1000

## 2020-10-20 MED ORDER — MIDAZOLAM HCL 2 MG/2ML IJ SOLN
INTRAMUSCULAR | Status: AC
  Filled 2020-10-20: qty 2

## 2020-10-20 SURGICAL SUPPLY — 14 items
CATH BALLN WEDGE 5F 110CM (CATHETERS) ×1 IMPLANT
CATH INFINITI 5FR JL5 (CATHETERS) ×1 IMPLANT
CATH INFINITI 5FR MULTPACK ANG (CATHETERS) ×1 IMPLANT
DEVICE RAD COMP TR BAND LRG (VASCULAR PRODUCTS) ×1 IMPLANT
ELECT DEFIB PAD ADLT CADENCE (PAD) ×1 IMPLANT
GLIDESHEATH SLEND SS 6F .021 (SHEATH) ×1 IMPLANT
GUIDEWIRE INQWIRE 1.5J.035X260 (WIRE) IMPLANT
INQWIRE 1.5J .035X260CM (WIRE) ×2
KIT HEART LEFT (KITS) ×2 IMPLANT
PACK CARDIAC CATHETERIZATION (CUSTOM PROCEDURE TRAY) ×2 IMPLANT
SHEATH GLIDE SLENDER 4/5FR (SHEATH) ×1 IMPLANT
TRANSDUCER W/STOPCOCK (MISCELLANEOUS) ×2 IMPLANT
TUBING CIL FLEX 10 FLL-RA (TUBING) ×2 IMPLANT
WIRE HI TORQ VERSACORE-J 145CM (WIRE) ×1 IMPLANT

## 2020-10-20 NOTE — Discharge Instructions (Signed)
Radial Site Care  This sheet gives you information about how to care for yourself after your procedure. Your health care provider may also give you more specific instructions. If you have problems or questions, contact your health care provider. What can I expect after the procedure? After the procedure, it is common to have: Bruising and tenderness at the catheter insertion area. Follow these instructions at home: Medicines Take over-the-counter and prescription medicines only as told by your health care provider. Insertion site care Follow instructions from your health care provider about how to take care of your insertion site. Make sure you: Wash your hands with soap and water before you remove your bandage (dressing). If soap and water are not available, use hand sanitizer. May remove dressing in 24 hours. Check your insertion site every day for signs of infection. Check for: Redness, swelling, or pain. Fluid or blood. Pus or a bad smell. Warmth. Do no take baths, swim, or use a hot tub for 5 days. You may shower 24-48 hours after the procedure. Remove the dressing and gently wash the site with plain soap and water. Pat the area dry with a clean towel. Do not rub the site. That could cause bleeding. Do not apply powder or lotion to the site. Activity  For 24 hours after the procedure, or as directed by your health care provider: Do not flex or bend the affected arm. Do not push or pull heavy objects with the affected arm. Do not drive yourself home from the hospital or clinic. You may drive 24 hours after the procedure. Do not operate machinery or power tools. KEEP ARM ELEVATED THE REMAINDER OF THE DAY. Do not push, pull or lift anything that is heavier than 10 lb for 5 days. Ask your health care provider when it is okay to: Return to work or school. Resume usual physical activities or sports. Resume sexual activity. General instructions If the catheter site starts to  bleed, raise your arm and put firm pressure on the site. If the bleeding does not stop, get help right away. This is a medical emergency. DRINK PLENTY OF FLUIDS FOR THE NEXT 2-3 DAYS. No alcohol consumption for 24 hours after receiving sedation. If you went home on the same day as your procedure, a responsible adult should be with you for the first 24 hours after you arrive home. Keep all follow-up visits as told by your health care provider. This is important. Contact a health care provider if: You have a fever. You have redness, swelling, or yellow drainage around your insertion site. Get help right away if: You have unusual pain at the radial site. The catheter insertion area swells very fast. The insertion area is bleeding, and the bleeding does not stop when you hold steady pressure on the area. Your arm or hand becomes pale, cool, tingly, or numb. These symptoms may represent a serious problem that is an emergency. Do not wait to see if the symptoms will go away. Get medical help right away. Call your local emergency services (911 in the [place] Do not drive yourself to the hospital. Summary After the procedure, it is common to have bruising and tenderness at the site. Follow instructions from your health care provider about how to take care of your radial site wound. Check the wound every day for signs of infection.  This information is not intended to replace advice given to you by your health care provider. Make sure you discuss any questions you have with   your health care provider. Document Revised: 02/02/2017 Document Reviewed: 02/02/2017 Elsevier Patient Education  2020 [employer]  

## 2020-10-20 NOTE — Interval H&P Note (Signed)
History and Physical Interval Note:  10/20/2020 7:08 AM  Justin Contreras  has presented today for surgery, with the diagnosis of thoracic aortic aneurysm and cardiomyopathy.  The various methods of treatment have been discussed with the patient and family. After consideration of risks, benefits and other options for treatment, the patient has consented to  Procedure(s): RIGHT/LEFT HEART CATH AND CORONARY ANGIOGRAPHY (N/A) as a surgical intervention.  The patient's history has been reviewed, patient examined, no change in status, stable for surgery.  I have reviewed the patient's chart and labs.  Questions were answered to the patient's satisfaction.    Cath Lab Visit (complete for each Cath Lab visit)  Clinical Evaluation Leading to the Procedure:   ACS: No.  Non-ACS:    Anginal/Heart Failure Classification: NYHA class III  Anti-ischemic medical therapy: Minimal Therapy (1 class of medications)  Non-Invasive Test Results: No non-invasive testing performed  Prior CABG: No previous CABG  Justin Contreras

## 2020-10-21 ENCOUNTER — Encounter: Payer: Self-pay | Admitting: *Deleted

## 2020-10-21 ENCOUNTER — Ambulatory Visit (INDEPENDENT_AMBULATORY_CARE_PROVIDER_SITE_OTHER): Payer: Medicare Other | Admitting: Thoracic Surgery (Cardiothoracic Vascular Surgery)

## 2020-10-21 ENCOUNTER — Encounter: Payer: Self-pay | Admitting: Thoracic Surgery (Cardiothoracic Vascular Surgery)

## 2020-10-21 ENCOUNTER — Other Ambulatory Visit: Payer: Self-pay | Admitting: *Deleted

## 2020-10-21 VITALS — BP 174/69 | HR 72 | Resp 20 | Ht 74.0 in | Wt 154.0 lb

## 2020-10-21 DIAGNOSIS — I7121 Aneurysm of the ascending aorta, without rupture: Secondary | ICD-10-CM | POA: Diagnosis not present

## 2020-10-21 DIAGNOSIS — G453 Amaurosis fugax: Secondary | ICD-10-CM

## 2020-10-21 DIAGNOSIS — R7309 Other abnormal glucose: Secondary | ICD-10-CM

## 2020-10-21 NOTE — H&P (View-Only) (Signed)
[address]       [place] [address]             [phone]      HPI: Justin Contreras returns to discuss management of his ascending aneurysm.  He is accompanied by his wife and son.  Justin Contreras is a 80 year old man with a history of prostate cancer with recurrence, left lower lobectomy for bronchiectasis, and hyperlipidemia.  He is followed for prostate cancer by Dr. Diona Contreras.  He had an elevated PSA.  That led to a PET/CT which showed recurrence of his prostate cancer but also showed an ascending thoracic aortic aneurysm.  He then had a CT of the chest, abdomen and pelvis which showed the aneurysm was approximately 5.7 cm in diameter.  Is not having any chest pain but does complain of general malaise, weight loss, and some shortness of breath with exertion.  I saw him on 10/01/2020 and we discussed possible surgical repair.  He needed an echocardiogram and a cardiac catheterization.  Those have now been done and he returns to discuss those results and further discuss management of his aneurysm.  Past Medical History:  Diagnosis Date   Bronchiectasis ([place])    Hyperlipidemia    Prostate cancer [facility])    Thoracic aortic aneurysm    Past Surgical History:  Procedure Laterality Date   KNEE ARTHROSCOPY Left    2014   mohs micrographic surgery nose     partial remove lung     2008   prostate surgery/transperinal placement of needles     RIGHT/LEFT HEART CATH AND CORONARY ANGIOGRAPHY N/A 10/20/2020   Procedure: RIGHT/LEFT HEART CATH AND CORONARY ANGIOGRAPHY;  Surgeon: Justin Bush, MD;  Location: [facility];  Service: Cardiovascular;  Laterality: N/A;   Contreras History  Problem Relation Age of Onset   Parkinson's disease Mother    Cancer Father    Cancer - Prostate Father     Current Outpatient Medications  Medication Sig Dispense Refill   aspirin EC 81 MG tablet Take 1 tablet (81 mg total) by mouth daily. Swallow whole. 90 tablet 0   carvedilol  (COREG) 3.125 MG tablet Take 1 tablet (3.125 mg total) by mouth 2 (two) times daily. 180 tablet 0   lisinopril (ZESTRIL) 5 MG tablet Take 1 tablet (5 mg total) by mouth daily. 90 tablet 0   Polyethyl Glycol-Propyl Glycol (SYSTANE OP) Place 1 drop into both eyes daily as needed (dry eyes).     No current facility-administered medications for this visit.    Physical Exam: BP (!) 174/69 (BP Location: Right Arm, Patient Position: Sitting, Cuff Size: Normal)   Pulse 72   Resp 20   Ht 6\' 2"  (1.88 m)   Wt 154 lb (69.9 kg)   SpO2 95% Comment: Ra  BMI 19.77 kg/m    Diagnostic Tests: Echocardiogram 10/06/2020 IMPRESSIONS     1. Left ventricular ejection fraction, by estimation, is 45%. The left  ventricle has mild to moderately decreased function. The left ventricle  demonstrates global hypokinesis. The left ventricular internal cavity size  was mildly dilated. There is mild  concentric left ventricular hypertrophy. Left ventricular diastolic  parameters are consistent with Grade I diastolic dysfunction (impaired  relaxation).   2. Right ventricular systolic function is normal. The right ventricular  size is normal.   3. The mitral valve is normal in structure. Mild mitral valve  regurgitation. No evidence of mitral stenosis.   4. The  aortic valve is normal in structure. Aortic valve regurgitation is  mild. Mild aortic valve sclerosis is present, with no evidence of aortic  valve stenosis.   5. There is severe dilatation of the aortic root and of the ascending  aorta, measuring 55 mm.   6. The inferior vena cava is dilated in size with <50% respiratory  variability, suggesting right atrial pressure of 15 mmHg.   Cardiac catheterization 10/20/2020 Conclusions: No angiographically significant coronary artery disease. Normal left heart, right heart, and pulmonary artery pressures. Normal Fick cardiac output/index.   Recommendations: Continue optimization of goal-directed medical  therapy, as tolerated, for management of nonischemic cardiomyopathy. Ongoing management of thoracic aortic aneurysm per Dr. Roxan Hockey.  Defer addition of statin at this time given reasonable LDL (96) and history of statin intolerance.   Justin Bush, MD [facility]  I reviewed the echocardiogram report and also reviewed the catheterization images.  He has no significant coronary disease.  He has ejection fraction of 45% with some mild global hypokinesis.  Mild AI and MR.  I also again reviewed his CT chest which shows a 5.8 cm ascending aneurysm  Impression: Justin Contreras is a 80 year old man with a history of prostate cancer with recurrence, left lower lobectomy for bronchiectasis, and hyperlipidemia.  He now has been found to have a 5.7- 5.8 cm ascending aneurysm.  Ascending thoracic aneurysm-size of aneurysm is concerning as it is greater than 5.5 cm.  We do not have any prior scans to compare to see if there has been change over time.  The rate of dissections and ruptures increases significantly when the aneurysm is larger than 5.5 cm.  He is a relatively low risk patient, so I think surgery should definitely be considered.  We discussed the options of medical management and observation versus surgical repair.  I informed Justin Contreras and his Contreras of the general nature of the surgery including the need for general anesthesia, the incisions to be used, the use of cardiopulmonary bypass, the use of hypothermic circulatory arrest, drainage tubes and pacing wires postoperatively.  I informed him of the indications, risks, benefits, and alternatives.  They understand the risks include, but are not limited to death, MI, DVT, PE, bleeding, need for transfusion, stroke or other neurologic dysfunction, infection, cardiac arrhythmias, respiratory or renal failure, as well as possibility of other unforeseeable complications.  He has had some issues with memory recently and that certainly could  worsen with this operation.  No evidence of coronary disease.  No aortic valve pathology.  I think his root can be preserved, but I will be prepared to replace the root if necessary.  I should be able to spare the valve in any case.  He is not certain if he wants to proceed with surgery.  He and his Contreras want to discuss this further.  I am perfectly fine with that and just asked him to give [place] a call once they have decided how they would like to proceed.  Plan: Repair of ascending thoracic aneurysm with moderate hypothermic circulatory arrest on Monday, 11/03/2020   Melrose Nakayama, MD [facility] [phone]

## 2020-10-21 NOTE — Progress Notes (Signed)
[address]       [place] [address]             [id number]      HPI: Justin Contreras returns to discuss management of his ascending aneurysm.  He is accompanied by his wife and son.  Justin Contreras is a 80 year old man with a history of prostate cancer with recurrence, left lower lobectomy for bronchiectasis, and hyperlipidemia.  He is followed for prostate cancer by Justin Contreras.  He had an elevated PSA.  That led to a PET/CT which showed recurrence of his prostate cancer but also showed an ascending thoracic aortic aneurysm.  He then had a CT of the chest, abdomen and pelvis which showed the aneurysm was approximately 5.7 cm in diameter.  Is not having any chest pain but does complain of general malaise, weight loss, and some shortness of breath with exertion.  I saw him on 10/01/2020 and we discussed possible surgical repair.  He needed an echocardiogram and a cardiac catheterization.  Those have now been done and he returns to discuss those results and further discuss management of his aneurysm.  Past Medical History:  Diagnosis Date   Bronchiectasis ([place])    Hyperlipidemia    Prostate cancer [facility])    Thoracic aortic aneurysm    Past Surgical History:  Procedure Laterality Date   KNEE ARTHROSCOPY Left    2014   mohs micrographic surgery nose     partial remove lung     2008   prostate surgery/transperinal placement of needles     RIGHT/LEFT HEART CATH AND CORONARY ANGIOGRAPHY N/A 10/20/2020   Procedure: RIGHT/LEFT HEART CATH AND CORONARY ANGIOGRAPHY;  Surgeon: Justin Bush, MD;  Location: [facility];  Service: Cardiovascular;  Laterality: N/A;   Family History  Problem Relation Age of Onset   Parkinson's disease Mother    Cancer Father    Cancer - Prostate Father     Current Outpatient Medications  Medication Sig Dispense Refill   aspirin EC 81 MG tablet Take 1 tablet (81 mg total) by mouth daily. Swallow whole. 90 tablet 0   carvedilol  (COREG) 3.125 MG tablet Take 1 tablet (3.125 mg total) by mouth 2 (two) times daily. 180 tablet 0   lisinopril (ZESTRIL) 5 MG tablet Take 1 tablet (5 mg total) by mouth daily. 90 tablet 0   Polyethyl Glycol-Propyl Glycol (SYSTANE OP) Place 1 drop into both eyes daily as needed (dry eyes).     No current facility-administered medications for this visit.    Physical Exam: BP (!) 174/69 (BP Location: Right Arm, Patient Position: Sitting, Cuff Size: Normal)   Pulse 72   Resp 20   Ht 6\' 2"  (1.88 m)   Wt 154 lb (69.9 kg)   SpO2 95% Comment: Ra  BMI 19.77 kg/m    Diagnostic Tests: Echocardiogram 10/06/2020 IMPRESSIONS     1. Left ventricular ejection fraction, by estimation, is 45%. The left  ventricle has mild to moderately decreased function. The left ventricle  demonstrates global hypokinesis. The left ventricular internal cavity size  was mildly dilated. There is mild  concentric left ventricular hypertrophy. Left ventricular diastolic  parameters are consistent with Grade I diastolic dysfunction (impaired  relaxation).   2. Right ventricular systolic function is normal. The right ventricular  size is normal.   3. The mitral valve is normal in structure. Mild mitral valve  regurgitation. No evidence of mitral stenosis.   4. The  aortic valve is normal in structure. Aortic valve regurgitation is  mild. Mild aortic valve sclerosis is present, with no evidence of aortic  valve stenosis.   5. There is severe dilatation of the aortic root and of the ascending  aorta, measuring 55 mm.   6. The inferior vena cava is dilated in size with <50% respiratory  variability, suggesting right atrial pressure of 15 mmHg.   Cardiac catheterization 10/20/2020 Conclusions: No angiographically significant coronary artery disease. Normal left heart, right heart, and pulmonary artery pressures. Normal Fick cardiac output/index.   Recommendations: Continue optimization of goal-directed medical  therapy, as tolerated, for management of nonischemic cardiomyopathy. Ongoing management of thoracic aortic aneurysm per Justin Contreras.  Defer addition of statin at this time given reasonable LDL (96) and history of statin intolerance.   Justin Bush, MD [facility]  I reviewed the echocardiogram report and also reviewed the catheterization images.  He has no significant coronary disease.  He has ejection fraction of 45% with some mild global hypokinesis.  Mild AI and MR.  I also again reviewed his CT chest which shows a 5.8 cm ascending aneurysm  Impression: Justin Contreras is a 80 year old man with a history of prostate cancer with recurrence, left lower lobectomy for bronchiectasis, and hyperlipidemia.  He now has been found to have a 5.7- 5.8 cm ascending aneurysm.  Ascending thoracic aneurysm-size of aneurysm is concerning as it is greater than 5.5 cm.  We do not have any prior scans to compare to see if there has been change over time.  The rate of dissections and ruptures increases significantly when the aneurysm is larger than 5.5 cm.  He is a relatively low risk patient, so I think surgery should definitely be considered.  We discussed the options of medical management and observation versus surgical repair.  I informed Justin Contreras and his family of the general nature of the surgery including the need for general anesthesia, the incisions to be used, the use of cardiopulmonary bypass, the use of hypothermic circulatory arrest, drainage tubes and pacing wires postoperatively.  I informed him of the indications, risks, benefits, and alternatives.  They understand the risks include, but are not limited to death, MI, DVT, PE, bleeding, need for transfusion, stroke or other neurologic dysfunction, infection, cardiac arrhythmias, respiratory or renal failure, as well as possibility of other unforeseeable complications.  He has had some issues with memory recently and that certainly could  worsen with this operation.  No evidence of coronary disease.  No aortic valve pathology.  I think his root can be preserved, but I will be prepared to replace the root if necessary.  I should be able to spare the valve in any case.  He is not certain if he wants to proceed with surgery.  He and his family want to discuss this further.  I am perfectly fine with that and just asked him to give [place] a call once they have decided how they would like to proceed.  Plan: Repair of ascending thoracic aneurysm with moderate hypothermic circulatory arrest on Monday, 11/03/2020   Melrose Nakayama, MD [facility] [phone]

## 2020-10-29 NOTE — Pre-Procedure Instructions (Signed)
Surgical Instructions    Your procedure is scheduled on Monday 11/03/20.   Report to [facility] Main Entrance "A" at 05:30 A.M., then check in with the Admitting office.  Call this number if you have problems the morning of surgery:  [phone]   If you have any questions prior to your surgery date call [phone]: Open Monday-Friday 8am-4pm    Remember:  Do not eat or drink after midnight the night before your surgery     Take these medicines the morning of surgery with A SIP OF WATER   carvedilol (COREG)  tamsulosin (FLOMAX)   Polyethyl Glycol-Propyl Glycol (SYSTANE OP)- If needed  Please follow your surgeon's instructions regarding Aspirin. If you have not received instructions then you need to contact your surgeon's office for instructions.   As of today, STOP taking any Aspirin (unless otherwise instructed by your surgeon) Aleve, Naproxen, Ibuprofen, Motrin, Advil, Goody's, BC's, all herbal medications, fish oil, and all vitamins.     After your COVID test   You are not required to quarantine however you are required to wear a well-fitting mask when you are out and around people not in your household.  If your mask becomes wet or soiled, replace with a new one.  Wash your hands often with soap and water for 20 seconds or clean your hands with an alcohol-based hand sanitizer that contains at least 60% alcohol.  Do not share personal items.  Notify your provider: if you are in close contact with someone who has COVID  or if you develop a fever of 100.4 or greater, sneezing, cough, sore throat, shortness of breath or body aches.             Do not wear jewelry or makeup Do not wear lotions, powders, perfumes/colognes, or deodorant. Do not shave 48 hours prior to surgery.  Men may shave face and neck. Do not bring valuables to the hospital. DO Not wear nail polish, gel polish, artificial nails, or any other type of covering on natural nails including finger and  toenails. If patients have artificial nails, gel coating, etc. that need to be removed by a nail salon, please have this removed prior to surgery or surgery may need to be canceled/delayed if the surgeon/ anesthesia feels like the patient is unable to be adequately monitored.             [facility] is not responsible for any belongings or valuables.  Do NOT Smoke (Tobacco/Vaping)  24 hours prior to your procedure  If you use a CPAP at night, you may bring your mask for your overnight stay.   Contacts, glasses, hearing aids, dentures or partials may not be worn into surgery, please bring cases for these belongings   For patients admitted to the hospital, discharge time will be determined by your treatment team.   Patients discharged the day of surgery will not be allowed to drive home, and someone needs to stay with them for 24 hours.  NO VISITORS WILL BE ALLOWED IN PRE-OP WHERE PATIENTS ARE PREPPED FOR SURGERY.  ONLY 1 SUPPORT PERSON MAY BE PRESENT IN THE WAITING ROOM WHILE YOU ARE IN SURGERY.  IF YOU ARE TO BE ADMITTED, ONCE YOU ARE IN YOUR ROOM YOU WILL BE ALLOWED TWO (2) VISITORS. 1 (ONE) VISITOR MAY STAY OVERNIGHT BUT MUST ARRIVE TO THE ROOM BY 8pm.  Minor children may have two parents present. Special consideration for safety and communication needs will be reviewed on a case by  case basis.  Special instructions:    Oral Hygiene is also important to reduce your risk of infection.  Remember - BRUSH YOUR TEETH THE MORNING OF SURGERY WITH YOUR REGULAR TOOTHPASTE   [facility] Preparing For [facility]  Before surgery, you can play an important role. Because skin is not sterile, your skin needs to be as free of germs as possible. You can reduce the number of germs on your skin by washing with CHG (chlorahexidine gluconate) Soap before surgery.  CHG is an antiseptic cleaner which kills germs and bonds with the skin to continue killing germs even after washing.     Please do not use if you  have an allergy to CHG or antibacterial soaps. If your skin becomes reddened/irritated stop using the CHG.  Do not shave (including legs and underarms) for at least 48 hours prior to first CHG shower. It is OK to shave your face.  Please follow these instructions carefully.     Shower the NIGHT BEFORE SURGERY and the MORNING OF SURGERY with CHG Soap.   If you chose to wash your hair, wash your hair first as usual with your normal shampoo. After you shampoo, rinse your hair and body thoroughly to remove the shampoo.  Then [employer] and genitals (private parts) with your normal soap and rinse thoroughly to remove soap.  After that Use CHG Soap as you would any other liquid soap. You can apply CHG directly to the skin and wash gently with a scrungie or a clean washcloth.   Apply the CHG Soap to your body ONLY FROM THE NECK DOWN.  Do not use on open wounds or open sores. Avoid contact with your eyes, ears, mouth and genitals (private parts). Wash Face and genitals (private parts)  with your normal soap.   Wash thoroughly, paying special attention to the area where your surgery will be performed.  Thoroughly rinse your body with warm water from the neck down.  DO NOT shower/wash with your normal soap after using and rinsing off the CHG Soap.  Pat yourself dry with a CLEAN TOWEL.  Wear CLEAN PAJAMAS to bed the night before surgery  Place CLEAN SHEETS on your bed the night before your surgery  DO NOT SLEEP WITH PETS.   Day of Surgery:  Take a shower with CHG soap. Wear Clean/Comfortable clothing the morning of surgery Do not apply any deodorants/lotions.   Remember to brush your teeth WITH YOUR REGULAR TOOTHPASTE.   Please read over the following fact sheets that you were given.

## 2020-10-30 ENCOUNTER — Other Ambulatory Visit: Payer: Self-pay

## 2020-10-30 ENCOUNTER — Encounter (HOSPITAL_COMMUNITY)
Admission: RE | Admit: 2020-10-30 | Discharge: 2020-10-30 | Disposition: A | Discharge status: Home / Self Care | Payer: Medicare Other | Source: Ambulatory Visit | Attending: Thoracic Surgery (Cardiothoracic Vascular Surgery) | Admitting: Thoracic Surgery (Cardiothoracic Vascular Surgery)

## 2020-10-30 ENCOUNTER — Encounter (HOSPITAL_COMMUNITY): Payer: Self-pay

## 2020-10-30 ENCOUNTER — Ambulatory Visit (HOSPITAL_COMMUNITY)
Admission: RE | Admit: 2020-10-30 | Discharge: 2020-10-30 | Disposition: A | Discharge status: Home / Self Care | Payer: Medicare Other | Source: Ambulatory Visit | Attending: Thoracic Surgery (Cardiothoracic Vascular Surgery) | Admitting: Thoracic Surgery (Cardiothoracic Vascular Surgery)

## 2020-10-30 VITALS — BP 148/55 | HR 59 | Temp 97.8°F | Resp 19 | Ht 74.0 in | Wt 156.4 lb

## 2020-10-30 DIAGNOSIS — Z01818 Encounter for other preprocedural examination: Secondary | ICD-10-CM | POA: Insufficient documentation

## 2020-10-30 DIAGNOSIS — E785 Hyperlipidemia, unspecified: Secondary | ICD-10-CM | POA: Diagnosis not present

## 2020-10-30 DIAGNOSIS — Z20822 Contact with and (suspected) exposure to covid-19: Secondary | ICD-10-CM | POA: Insufficient documentation

## 2020-10-30 DIAGNOSIS — Z87891 Personal history of nicotine dependence: Secondary | ICD-10-CM | POA: Insufficient documentation

## 2020-10-30 DIAGNOSIS — I7121 Aneurysm of the ascending aorta, without rupture: Secondary | ICD-10-CM | POA: Insufficient documentation

## 2020-10-30 DIAGNOSIS — R7309 Other abnormal glucose: Secondary | ICD-10-CM | POA: Insufficient documentation

## 2020-10-30 DIAGNOSIS — Z79899 Other long term (current) drug therapy: Secondary | ICD-10-CM | POA: Insufficient documentation

## 2020-10-30 DIAGNOSIS — G453 Amaurosis fugax: Secondary | ICD-10-CM | POA: Insufficient documentation

## 2020-10-30 HISTORY — DX: Essential (primary) hypertension: I10

## 2020-10-30 HISTORY — DX: Dyspnea, unspecified: R06.00

## 2020-10-30 HISTORY — DX: Pneumonia, unspecified organism: J18.9

## 2020-10-30 LAB — BLOOD GAS, ARTERIAL
Acid-Base Excess: 2.1 mmol/L — ABNORMAL HIGH (ref 0.0–2.0)
Bicarbonate: 26.2 mmol/L (ref 20.0–28.0)
Drawn by: 58793
FIO2: 21
O2 Saturation: 98.3 %
Patient temperature: 37
pCO2 arterial: 41.2 mmHg (ref 32.0–48.0)
pH, Arterial: 7.42 (ref 7.350–7.450)
pO2, Arterial: 107 mmHg (ref 83.0–108.0)

## 2020-10-30 LAB — CBC
HCT: 43.4 % (ref 39.0–52.0)
Hemoglobin: 14.4 g/dL (ref 13.0–17.0)
MCH: 30.8 pg (ref 26.0–34.0)
MCHC: 33.2 g/dL (ref 30.0–36.0)
MCV: 92.7 fL (ref 80.0–100.0)
Platelets: 237 10*3/uL (ref 150–400)
RBC: 4.68 MIL/uL (ref 4.22–5.81)
RDW: 13.2 % (ref 11.5–15.5)
WBC: 4.7 10*3/uL (ref 4.0–10.5)
nRBC: 0 % (ref 0.0–0.2)

## 2020-10-30 LAB — URINALYSIS, ROUTINE W REFLEX MICROSCOPIC
Bilirubin Urine: NEGATIVE
Glucose, UA: NEGATIVE mg/dL
Hgb urine dipstick: NEGATIVE
Ketones, ur: NEGATIVE mg/dL
Leukocytes,Ua: NEGATIVE
Nitrite: NEGATIVE
Protein, ur: NEGATIVE mg/dL
Specific Gravity, Urine: 1.017 (ref 1.005–1.030)
pH: 6 (ref 5.0–8.0)

## 2020-10-30 LAB — COMPREHENSIVE METABOLIC PANEL
ALT: 25 U/L (ref 0–44)
AST: 27 U/L (ref 15–41)
Albumin: 3.9 g/dL (ref 3.5–5.0)
Alkaline Phosphatase: 70 U/L (ref 38–126)
Anion gap: 10 (ref 5–15)
BUN: 20 mg/dL (ref 8–23)
CO2: 24 mmol/L (ref 22–32)
Calcium: 9.1 mg/dL (ref 8.9–10.3)
Chloride: 104 mmol/L (ref 98–111)
Creatinine, Ser: 0.79 mg/dL (ref 0.61–1.24)
GFR, Estimated: >60 mL/min (ref >60)
Glucose, Bld: 103 mg/dL — ABNORMAL HIGH (ref 70–99)
Potassium: 4 mmol/L (ref 3.5–5.1)
Sodium: 138 mmol/L (ref 135–145)
Total Bilirubin: 0.9 mg/dL (ref 0.3–1.2)
Total Protein: 6.5 g/dL (ref 6.5–8.1)

## 2020-10-30 LAB — SARS CORONAVIRUS 2 (TAT 6-24 HRS): SARS Coronavirus 2: NEGATIVE

## 2020-10-30 LAB — APTT: aPTT: 31 seconds (ref 24–36)

## 2020-10-30 LAB — SURGICAL PCR SCREEN
MRSA, PCR: NEGATIVE
Staphylococcus aureus: POSITIVE — AB

## 2020-10-30 LAB — PROTIME-INR
INR: 1.1 (ref 0.8–1.2)
Prothrombin Time: 13.8 seconds (ref 11.4–15.2)

## 2020-10-30 NOTE — Progress Notes (Signed)
PCP - Dr. Gwenyth Allegra Cardiologist - Dr. Harrell Gave End  PPM/ICD - n/a Device Orders - n/a Rep Notified - n/a  Chest x-ray - 10/30/20 (Within 2 weeks) EKG - 10/16/20 (Within 1 month) Stress Test - denies ECHO - 10/06/20 Cardiac Cath - 10/20/20  Sleep Study - denies CPAP - n/a  Fasting Blood Sugar - n/a Checks Blood Sugar _ n/a _ times a day  Blood Thinner Instructions: n/a Aspirin Instructions: As of today, STOP taking any Aspirin (unless otherwise instructed by your surgeon) Aleve, Naproxen, Ibuprofen, Motrin, Advil, Goody's, BC's, all herbal medications, fish oil, and all vitamins.    ERAS Protcol - No PRE-SURGERY Ensure or G2- n/a  COVID TEST- 10/30/20. Pending   Anesthesia review: Yes  Patient denies shortness of breath, fever, cough and chest pain at PAT appointment   All instructions explained to the patient, with a verbal understanding of the material. Patient agrees to go over the instructions while at home for a better understanding. Patient also instructed to self quarantine after being tested for COVID-19. The opportunity to ask questions was provided.

## 2020-10-30 NOTE — Progress Notes (Signed)
I sent Levonne Spiller, RN a staff message, informing of PCP - positive for staph.  I told her that A1C was sent out and not resulted.  Covid test is in process.

## 2020-10-31 LAB — HEMOGLOBIN A1C
Hgb A1c MFr Bld: 5.8 % — ABNORMAL HIGH (ref 4.8–5.6)
Mean Plasma Glucose: 120 mg/dL

## 2020-11-02 MED ORDER — NITROGLYCERIN IN D5W 200-5 MCG/ML-% IV SOLN
2.0000 ug/min | INTRAVENOUS | Status: DC
  Filled 2020-11-02 (×2): qty 250

## 2020-11-02 MED ORDER — HEPARIN 30,000 UNITS/1000 ML (OHS) CELLSAVER SOLUTION
Status: DC
  Filled 2020-11-02 (×2): qty 1000

## 2020-11-02 MED ORDER — MAGNESIUM SULFATE 50 % IJ SOLN
40.0000 meq | INTRAMUSCULAR | Status: DC
  Filled 2020-11-02 (×2): qty 9.85

## 2020-11-02 MED ORDER — TRANEXAMIC ACID 1000 MG/10ML IV SOLN
1.5000 mg/kg/h | INTRAVENOUS | Status: AC
  Administered 2020-11-03: 1.5 mg/kg/h via INTRAVENOUS
  Filled 2020-11-02 (×2): qty 25

## 2020-11-02 MED ORDER — PLASMA-LYTE A IV SOLN
INTRAVENOUS | Status: DC
  Filled 2020-11-02 (×2): qty 5

## 2020-11-02 MED ORDER — MILRINONE LACTATE IN DEXTROSE 20-5 MG/100ML-% IV SOLN
0.3000 ug/kg/min | INTRAVENOUS | Status: AC
  Administered 2020-11-03: .25 ug/kg/min via INTRAVENOUS
  Filled 2020-11-02 (×2): qty 100

## 2020-11-02 MED ORDER — CEFAZOLIN SODIUM-DEXTROSE 2-4 GM/100ML-% IV SOLN
2.0000 g | INTRAVENOUS | Status: AC
  Administered 2020-11-03: 2 g via INTRAVENOUS
  Filled 2020-11-02: qty 100

## 2020-11-02 MED ORDER — EPINEPHRINE HCL 5 MG/250ML IV SOLN IN NS
0.0000 ug/min | INTRAVENOUS | Status: AC
  Administered 2020-11-03: 3 ug/min via INTRAVENOUS
  Filled 2020-11-02 (×2): qty 250

## 2020-11-02 MED ORDER — DEXMEDETOMIDINE HCL IN NACL 400 MCG/100ML IV SOLN
0.1000 ug/kg/h | INTRAVENOUS | Status: AC
  Administered 2020-11-03: .7 ug/kg/h via INTRAVENOUS
  Filled 2020-11-02 (×2): qty 100

## 2020-11-02 MED ORDER — POTASSIUM CHLORIDE 2 MEQ/ML IV SOLN
80.0000 meq | INTRAVENOUS | Status: DC
  Filled 2020-11-02 (×2): qty 40

## 2020-11-02 MED ORDER — PHENYLEPHRINE HCL-NACL 20-0.9 MG/250ML-% IV SOLN
30.0000 ug/min | INTRAVENOUS | Status: AC
Start: 2020-11-03 — End: 2020-11-03
  Administered 2020-11-03: 25 ug/min via INTRAVENOUS
  Filled 2020-11-02 (×2): qty 250

## 2020-11-02 MED ORDER — VANCOMYCIN HCL 1250 MG/250ML IV SOLN
1250.0000 mg | INTRAVENOUS | Status: AC
  Administered 2020-11-03: 1250 mg via INTRAVENOUS
  Filled 2020-11-02 (×2): qty 250

## 2020-11-02 MED ORDER — NOREPINEPHRINE 4 MG/250ML-% IV SOLN
0.0000 ug/min | INTRAVENOUS | Status: AC
  Administered 2020-11-03: 5 ug/min via INTRAVENOUS
  Filled 2020-11-02 (×2): qty 250

## 2020-11-02 MED ORDER — INSULIN REGULAR(HUMAN) IN NACL 100-0.9 UT/100ML-% IV SOLN
INTRAVENOUS | Status: AC
  Administered 2020-11-03: .9 [IU]/h via INTRAVENOUS
  Filled 2020-11-02 (×2): qty 100

## 2020-11-02 MED ORDER — TRANEXAMIC ACID (OHS) BOLUS VIA INFUSION
15.0000 mg/kg | INTRAVENOUS | Status: AC
  Administered 2020-11-03: 1063.5 mg via INTRAVENOUS
  Filled 2020-11-02 (×2): qty 1064

## 2020-11-02 MED ORDER — TRANEXAMIC ACID (OHS) PUMP PRIME SOLUTION
2.0000 mg/kg | INTRAVENOUS | Status: DC
  Filled 2020-11-02 (×2): qty 1.42

## 2020-11-02 NOTE — Anesthesia Preprocedure Evaluation (Addendum)
Anesthesia Evaluation  Patient identified by MRN, date of birth, ID band Patient awake    Reviewed: Allergy & Precautions, NPO status , Patient's Chart, lab work & pertinent test results  History of Anesthesia Complications Negative for: history of anesthetic complications  Airway Mallampati: II  TM Distance: >3 FB Neck ROM: Full    Dental no notable dental hx. (+) Dental Advisory Given   Pulmonary COPD, former smoker,    Pulmonary exam normal        Cardiovascular hypertension, Pt. on home beta blockers and Pt. on medications +CHF  Normal cardiovascular exam  IMPRESSIONS    1. Left ventricular ejection fraction, by estimation, is 45%. The left  ventricle has mild to moderately decreased function. The left ventricle  demonstrates global hypokinesis. The left ventricular internal cavity size  was mildly dilated. There is mild  concentric left ventricular hypertrophy. Left ventricular diastolic  parameters are consistent with Grade I diastolic dysfunction (impaired  relaxation).  2. Right ventricular systolic function is normal. The right ventricular  size is normal.  3. The mitral valve is normal in structure. Mild mitral valve  regurgitation. No evidence of mitral stenosis.  4. The aortic valve is normal in structure. Aortic valve regurgitation is  mild. Mild aortic valve sclerosis is present, with no evidence of aortic  valve stenosis.  5. There is severe dilatation of the aortic root and of the ascending  aorta, measuring 55 mm.  6. The inferior vena cava is dilated in size with <50% respiratory  variability, suggesting right atrial pressure of 15 mmHg.   Cath 10/2020 Conclusions: No angiographically significant coronary artery disease. Normal left heart, right heart, and pulmonary artery pressures. Normal Fick cardiac output/index.  Recommendations: Continue optimization of goal-directed medical therapy, as  tolerated, for  management of nonischemic cardiomyopathy. Ongoing management of thoracic aortic aneurysm per Dr. Roxan Hockey. Defer  addition of statin at this time given reasonable LDL (96) and history of  statin intolerance.    Neuro/Psych Anxiety negative neurological ROS     GI/Hepatic negative GI ROS, Neg liver ROS,   Endo/Other  negative endocrine ROS  Renal/GU negative Renal ROS     Musculoskeletal negative musculoskeletal ROS (+)   Abdominal   Peds  Hematology negative hematology ROS (+)   Anesthesia Other Findings   Reproductive/Obstetrics                            Anesthesia Physical Anesthesia Plan  ASA: 4  Anesthesia Plan: General   Post-op Pain Management:    Induction: Intravenous  PONV Risk Score and Plan: 3 and Ondansetron, Dexamethasone and Treatment may vary due to age or medical condition  Airway Management Planned: Oral ETT  Additional Equipment: Arterial line, 3D TEE, PA Cath and Ultrasound Guidance Line Placement  Intra-op Plan:   Post-operative Plan: Post-operative intubation/ventilation  Informed Consent: I have reviewed the patients History and Physical, chart, labs and discussed the procedure including the risks, benefits and alternatives for the proposed anesthesia with the patient or authorized representative who has indicated his/her understanding and acceptance.     Dental advisory given  Plan Discussed with: Anesthesiologist, Surgeon and CRNA  Anesthesia Plan Comments:        Anesthesia Quick Evaluation

## 2020-11-03 ENCOUNTER — Inpatient Hospital Stay (HOSPITAL_COMMUNITY): Payer: Medicare Other | Admitting: Certified Registered Nurse Anesthetist

## 2020-11-03 ENCOUNTER — Encounter (HOSPITAL_COMMUNITY): Payer: Self-pay | Admitting: Thoracic Surgery (Cardiothoracic Vascular Surgery)

## 2020-11-03 ENCOUNTER — Inpatient Hospital Stay (HOSPITAL_COMMUNITY)
Admission: RE | Admit: 2020-11-03 | Discharge: 2020-11-09 | DRG: 220 | Disposition: A | Discharge status: Home Health | Payer: Medicare Other | Attending: Thoracic Surgery (Cardiothoracic Vascular Surgery) | Admitting: Thoracic Surgery (Cardiothoracic Vascular Surgery)

## 2020-11-03 ENCOUNTER — Other Ambulatory Visit: Payer: Self-pay

## 2020-11-03 ENCOUNTER — Inpatient Hospital Stay (HOSPITAL_COMMUNITY): Payer: Medicare Other

## 2020-11-03 ENCOUNTER — Encounter (HOSPITAL_COMMUNITY)
Admission: RE | Disposition: A | Discharge status: Home / Self Care | Payer: Self-pay | Source: Home / Self Care | Attending: Thoracic Surgery (Cardiothoracic Vascular Surgery)

## 2020-11-03 DIAGNOSIS — I480 Paroxysmal atrial fibrillation: Secondary | ICD-10-CM | POA: Diagnosis present

## 2020-11-03 DIAGNOSIS — Z87891 Personal history of nicotine dependence: Secondary | ICD-10-CM

## 2020-11-03 DIAGNOSIS — Z902 Acquired absence of lung [part of]: Secondary | ICD-10-CM | POA: Diagnosis not present

## 2020-11-03 DIAGNOSIS — Y842 Radiological procedure and radiotherapy as the cause of abnormal reaction of the patient, or of later complication, without mention of misadventure at the time of the procedure: Secondary | ICD-10-CM | POA: Diagnosis present

## 2020-11-03 DIAGNOSIS — J449 Chronic obstructive pulmonary disease, unspecified: Secondary | ICD-10-CM | POA: Diagnosis present

## 2020-11-03 DIAGNOSIS — D62 Acute posthemorrhagic anemia: Secondary | ICD-10-CM | POA: Diagnosis not present

## 2020-11-03 DIAGNOSIS — I459 Conduction disorder, unspecified: Secondary | ICD-10-CM | POA: Diagnosis present

## 2020-11-03 DIAGNOSIS — R000 Tachycardia, unspecified: Secondary | ICD-10-CM | POA: Diagnosis present

## 2020-11-03 DIAGNOSIS — I712 Thoracic aortic aneurysm, without rupture, unspecified: Secondary | ICD-10-CM | POA: Diagnosis present

## 2020-11-03 DIAGNOSIS — I7121 Aneurysm of the ascending aorta, without rupture: Secondary | ICD-10-CM | POA: Diagnosis present

## 2020-11-03 DIAGNOSIS — I7123 Aneurysm of the descending thoracic aorta, without rupture: Secondary | ICD-10-CM

## 2020-11-03 DIAGNOSIS — D6959 Other secondary thrombocytopenia: Secondary | ICD-10-CM | POA: Diagnosis not present

## 2020-11-03 DIAGNOSIS — Z09 Encounter for follow-up examination after completed treatment for conditions other than malignant neoplasm: Secondary | ICD-10-CM

## 2020-11-03 DIAGNOSIS — C61 Malignant neoplasm of prostate: Secondary | ICD-10-CM | POA: Diagnosis present

## 2020-11-03 DIAGNOSIS — Z820 Family history of epilepsy and other diseases of the nervous system: Secondary | ICD-10-CM

## 2020-11-03 DIAGNOSIS — I319 Disease of pericardium, unspecified: Secondary | ICD-10-CM | POA: Diagnosis present

## 2020-11-03 DIAGNOSIS — Z79899 Other long term (current) drug therapy: Secondary | ICD-10-CM

## 2020-11-03 DIAGNOSIS — M810 Age-related osteoporosis without current pathological fracture: Secondary | ICD-10-CM | POA: Diagnosis present

## 2020-11-03 DIAGNOSIS — Z923 Personal history of irradiation: Secondary | ICD-10-CM

## 2020-11-03 DIAGNOSIS — R443 Hallucinations, unspecified: Secondary | ICD-10-CM | POA: Diagnosis not present

## 2020-11-03 DIAGNOSIS — Z9889 Other specified postprocedural states: Secondary | ICD-10-CM

## 2020-11-03 DIAGNOSIS — R319 Hematuria, unspecified: Secondary | ICD-10-CM | POA: Diagnosis not present

## 2020-11-03 DIAGNOSIS — I110 Hypertensive heart disease with heart failure: Secondary | ICD-10-CM | POA: Diagnosis present

## 2020-11-03 DIAGNOSIS — I428 Other cardiomyopathies: Secondary | ICD-10-CM | POA: Diagnosis present

## 2020-11-03 DIAGNOSIS — Z7982 Long term (current) use of aspirin: Secondary | ICD-10-CM | POA: Diagnosis not present

## 2020-11-03 DIAGNOSIS — Z20822 Contact with and (suspected) exposure to covid-19: Secondary | ICD-10-CM | POA: Diagnosis present

## 2020-11-03 DIAGNOSIS — Z8679 Personal history of other diseases of the circulatory system: Secondary | ICD-10-CM

## 2020-11-03 DIAGNOSIS — I5022 Chronic systolic (congestive) heart failure: Secondary | ICD-10-CM | POA: Diagnosis present

## 2020-11-03 DIAGNOSIS — R58 Hemorrhage, not elsewhere classified: Secondary | ICD-10-CM

## 2020-11-03 DIAGNOSIS — J90 Pleural effusion, not elsewhere classified: Secondary | ICD-10-CM | POA: Diagnosis not present

## 2020-11-03 DIAGNOSIS — E785 Hyperlipidemia, unspecified: Secondary | ICD-10-CM | POA: Diagnosis present

## 2020-11-03 DIAGNOSIS — I3139 Other pericardial effusion (noninflammatory): Secondary | ICD-10-CM | POA: Diagnosis not present

## 2020-11-03 DIAGNOSIS — Z8042 Family history of malignant neoplasm of prostate: Secondary | ICD-10-CM | POA: Diagnosis not present

## 2020-11-03 DIAGNOSIS — E8770 Fluid overload, unspecified: Secondary | ICD-10-CM | POA: Diagnosis not present

## 2020-11-03 DIAGNOSIS — G453 Amaurosis fugax: Secondary | ICD-10-CM

## 2020-11-03 DIAGNOSIS — R339 Retention of urine, unspecified: Secondary | ICD-10-CM | POA: Diagnosis not present

## 2020-11-03 DIAGNOSIS — R6889 Other general symptoms and signs: Secondary | ICD-10-CM

## 2020-11-03 HISTORY — PX: TEE WITHOUT CARDIOVERSION: SHX5443

## 2020-11-03 HISTORY — PX: THORACIC AORTIC ANEURYSM REPAIR: SHX799

## 2020-11-03 LAB — ECHO INTRAOPERATIVE TEE
Height: 74 in
Weight: 2496 oz

## 2020-11-03 LAB — POCT I-STAT 7, (LYTES, BLD GAS, ICA,H+H)
Acid-Base Excess: 3 mmol/L — ABNORMAL HIGH (ref 0.0–2.0)
Acid-Base Excess: 3 mmol/L — ABNORMAL HIGH (ref 0.0–2.0)
Acid-Base Excess: 1 mmol/L (ref 0.0–2.0)
Acid-Base Excess: 3 mmol/L — ABNORMAL HIGH (ref 0.0–2.0)
Acid-Base Excess: 0 mmol/L (ref 0.0–2.0)
Acid-Base Excess: 2 mmol/L (ref 0.0–2.0)
Acid-Base Excess: 0 mmol/L (ref 0.0–2.0)
Acid-base deficit: 1 mmol/L (ref 0.0–2.0)
Bicarbonate: 29 mmol/L — ABNORMAL HIGH (ref 20.0–28.0)
Bicarbonate: 28.3 mmol/L — ABNORMAL HIGH (ref 20.0–28.0)
Bicarbonate: 26.9 mmol/L (ref 20.0–28.0)
Bicarbonate: 27.4 mmol/L (ref 20.0–28.0)
Bicarbonate: 24.6 mmol/L (ref 20.0–28.0)
Bicarbonate: 27.1 mmol/L (ref 20.0–28.0)
Bicarbonate: 24.9 mmol/L (ref 20.0–28.0)
Bicarbonate: 24 mmol/L (ref 20.0–28.0)
Calcium, Ion: 1.25 mmol/L (ref 1.15–1.40)
Calcium, Ion: 1.08 mmol/L — ABNORMAL LOW (ref 1.15–1.40)
Calcium, Ion: 1.11 mmol/L — ABNORMAL LOW (ref 1.15–1.40)
Calcium, Ion: 1.07 mmol/L — ABNORMAL LOW (ref 1.15–1.40)
Calcium, Ion: 1.02 mmol/L — ABNORMAL LOW (ref 1.15–1.40)
Calcium, Ion: 1.57 mmol/L (ref 1.15–1.40)
Calcium, Ion: 1.27 mmol/L (ref 1.15–1.40)
Calcium, Ion: 1.23 mmol/L (ref 1.15–1.40)
HCT: 37 % — ABNORMAL LOW (ref 39.0–52.0)
HCT: 30 % — ABNORMAL LOW (ref 39.0–52.0)
HCT: 30 % — ABNORMAL LOW (ref 39.0–52.0)
HCT: 27 % — ABNORMAL LOW (ref 39.0–52.0)
HCT: 24 % — ABNORMAL LOW (ref 39.0–52.0)
HCT: 24 % — ABNORMAL LOW (ref 39.0–52.0)
HCT: 29 % — ABNORMAL LOW (ref 39.0–52.0)
HCT: 24 % — ABNORMAL LOW (ref 39.0–52.0)
Hemoglobin: 12.6 g/dL — ABNORMAL LOW (ref 13.0–17.0)
Hemoglobin: 10.2 g/dL — ABNORMAL LOW (ref 13.0–17.0)
Hemoglobin: 10.2 g/dL — ABNORMAL LOW (ref 13.0–17.0)
Hemoglobin: 9.2 g/dL — ABNORMAL LOW (ref 13.0–17.0)
Hemoglobin: 8.2 g/dL — ABNORMAL LOW (ref 13.0–17.0)
Hemoglobin: 8.2 g/dL — ABNORMAL LOW (ref 13.0–17.0)
Hemoglobin: 9.9 g/dL — ABNORMAL LOW (ref 13.0–17.0)
Hemoglobin: 8.2 g/dL — ABNORMAL LOW (ref 13.0–17.0)
O2 Saturation: 100 %
O2 Saturation: 100 %
O2 Saturation: 100 %
O2 Saturation: 100 %
O2 Saturation: 100 %
O2 Saturation: 99 %
O2 Saturation: 100 %
O2 Saturation: 98 %
Patient temperature: 35
Patient temperature: 36.7
Potassium: 3.7 mmol/L (ref 3.5–5.1)
Potassium: 3.9 mmol/L (ref 3.5–5.1)
Potassium: 4.6 mmol/L (ref 3.5–5.1)
Potassium: 4.7 mmol/L (ref 3.5–5.1)
Potassium: 4 mmol/L (ref 3.5–5.1)
Potassium: 4.7 mmol/L (ref 3.5–5.1)
Potassium: 3.5 mmol/L (ref 3.5–5.1)
Potassium: 4.1 mmol/L (ref 3.5–5.1)
Sodium: 141 mmol/L (ref 135–145)
Sodium: 141 mmol/L (ref 135–145)
Sodium: 138 mmol/L (ref 135–145)
Sodium: 140 mmol/L (ref 135–145)
Sodium: 142 mmol/L (ref 135–145)
Sodium: 143 mmol/L (ref 135–145)
Sodium: 142 mmol/L (ref 135–145)
Sodium: 141 mmol/L (ref 135–145)
TCO2: 31 mmol/L (ref 22–32)
TCO2: 30 mmol/L (ref 22–32)
TCO2: 28 mmol/L (ref 22–32)
TCO2: 29 mmol/L (ref 22–32)
TCO2: 26 mmol/L (ref 22–32)
TCO2: 28 mmol/L (ref 22–32)
TCO2: 26 mmol/L (ref 22–32)
TCO2: 25 mmol/L (ref 22–32)
pCO2 arterial: 51.2 mmHg — ABNORMAL HIGH (ref 32.0–48.0)
pCO2 arterial: 45.7 mmHg (ref 32.0–48.0)
pCO2 arterial: 45.9 mmHg (ref 32.0–48.0)
pCO2 arterial: 37.7 mmHg (ref 32.0–48.0)
pCO2 arterial: 39 mmHg (ref 32.0–48.0)
pCO2 arterial: 42 mmHg (ref 32.0–48.0)
pCO2 arterial: 39.4 mmHg (ref 32.0–48.0)
pCO2 arterial: 41.6 mmHg (ref 32.0–48.0)
pH, Arterial: 7.361 (ref 7.350–7.450)
pH, Arterial: 7.401 (ref 7.350–7.450)
pH, Arterial: 7.375 (ref 7.350–7.450)
pH, Arterial: 7.469 — ABNORMAL HIGH (ref 7.350–7.450)
pH, Arterial: 7.408 (ref 7.350–7.450)
pH, Arterial: 7.418 (ref 7.350–7.450)
pH, Arterial: 7.4 (ref 7.350–7.450)
pH, Arterial: 7.367 (ref 7.350–7.450)
pO2, Arterial: 465 mmHg — ABNORMAL HIGH (ref 83.0–108.0)
pO2, Arterial: 440 mmHg — ABNORMAL HIGH (ref 83.0–108.0)
pO2, Arterial: 411 mmHg — ABNORMAL HIGH (ref 83.0–108.0)
pO2, Arterial: 357 mmHg — ABNORMAL HIGH (ref 83.0–108.0)
pO2, Arterial: 139 mmHg — ABNORMAL HIGH (ref 83.0–108.0)
pO2, Arterial: 356 mmHg — ABNORMAL HIGH (ref 83.0–108.0)
pO2, Arterial: 212 mmHg — ABNORMAL HIGH (ref 83.0–108.0)
pO2, Arterial: 110 mmHg — ABNORMAL HIGH (ref 83.0–108.0)

## 2020-11-03 LAB — POCT I-STAT, CHEM 8
BUN: 15 mg/dL (ref 8–23)
BUN: 13 mg/dL (ref 8–23)
BUN: 13 mg/dL (ref 8–23)
BUN: 14 mg/dL (ref 8–23)
BUN: 14 mg/dL (ref 8–23)
BUN: 13 mg/dL (ref 8–23)
Calcium, Ion: 1.24 mmol/L (ref 1.15–1.40)
Calcium, Ion: 1.05 mmol/L — ABNORMAL LOW (ref 1.15–1.40)
Calcium, Ion: 1.2 mmol/L (ref 1.15–1.40)
Calcium, Ion: 1.07 mmol/L — ABNORMAL LOW (ref 1.15–1.40)
Calcium, Ion: 1.02 mmol/L — ABNORMAL LOW (ref 1.15–1.40)
Calcium, Ion: 1.61 mmol/L (ref 1.15–1.40)
Chloride: 102 mmol/L (ref 98–111)
Chloride: 102 mmol/L (ref 98–111)
Chloride: 103 mmol/L (ref 98–111)
Chloride: 103 mmol/L (ref 98–111)
Chloride: 105 mmol/L (ref 98–111)
Chloride: 109 mmol/L (ref 98–111)
Creatinine, Ser: 0.6 mg/dL — ABNORMAL LOW (ref 0.61–1.24)
Creatinine, Ser: 0.6 mg/dL — ABNORMAL LOW (ref 0.61–1.24)
Creatinine, Ser: 0.6 mg/dL — ABNORMAL LOW (ref 0.61–1.24)
Creatinine, Ser: 0.7 mg/dL (ref 0.61–1.24)
Creatinine, Ser: 0.6 mg/dL — ABNORMAL LOW (ref 0.61–1.24)
Creatinine, Ser: 0.7 mg/dL (ref 0.61–1.24)
Glucose, Bld: 100 mg/dL — ABNORMAL HIGH (ref 70–99)
Glucose, Bld: 107 mg/dL — ABNORMAL HIGH (ref 70–99)
Glucose, Bld: 116 mg/dL — ABNORMAL HIGH (ref 70–99)
Glucose, Bld: 135 mg/dL — ABNORMAL HIGH (ref 70–99)
Glucose, Bld: 138 mg/dL — ABNORMAL HIGH (ref 70–99)
Glucose, Bld: 128 mg/dL — ABNORMAL HIGH (ref 70–99)
HCT: 34 % — ABNORMAL LOW (ref 39.0–52.0)
HCT: 30 % — ABNORMAL LOW (ref 39.0–52.0)
HCT: 32 % — ABNORMAL LOW (ref 39.0–52.0)
HCT: 28 % — ABNORMAL LOW (ref 39.0–52.0)
HCT: 26 % — ABNORMAL LOW (ref 39.0–52.0)
HCT: 22 % — ABNORMAL LOW (ref 39.0–52.0)
Hemoglobin: 11.6 g/dL — ABNORMAL LOW (ref 13.0–17.0)
Hemoglobin: 10.2 g/dL — ABNORMAL LOW (ref 13.0–17.0)
Hemoglobin: 10.9 g/dL — ABNORMAL LOW (ref 13.0–17.0)
Hemoglobin: 9.5 g/dL — ABNORMAL LOW (ref 13.0–17.0)
Hemoglobin: 8.8 g/dL — ABNORMAL LOW (ref 13.0–17.0)
Hemoglobin: 7.5 g/dL — ABNORMAL LOW (ref 13.0–17.0)
Potassium: 3.7 mmol/L (ref 3.5–5.1)
Potassium: 3.5 mmol/L (ref 3.5–5.1)
Potassium: 4.6 mmol/L (ref 3.5–5.1)
Potassium: 4.6 mmol/L (ref 3.5–5.1)
Potassium: 4.8 mmol/L (ref 3.5–5.1)
Potassium: 4 mmol/L (ref 3.5–5.1)
Sodium: 141 mmol/L (ref 135–145)
Sodium: 139 mmol/L (ref 135–145)
Sodium: 142 mmol/L (ref 135–145)
Sodium: 138 mmol/L (ref 135–145)
Sodium: 139 mmol/L (ref 135–145)
Sodium: 141 mmol/L (ref 135–145)
TCO2: 29 mmol/L (ref 22–32)
TCO2: 30 mmol/L (ref 22–32)
TCO2: 30 mmol/L (ref 22–32)
TCO2: 29 mmol/L (ref 22–32)
TCO2: 28 mmol/L (ref 22–32)
TCO2: 24 mmol/L (ref 22–32)

## 2020-11-03 LAB — CBC
HCT: 30.6 % — ABNORMAL LOW (ref 39.0–52.0)
HCT: 26.9 % — ABNORMAL LOW (ref 39.0–52.0)
Hemoglobin: 10.1 g/dL — ABNORMAL LOW (ref 13.0–17.0)
Hemoglobin: 8.7 g/dL — ABNORMAL LOW (ref 13.0–17.0)
MCH: 30.8 pg (ref 26.0–34.0)
MCH: 30.2 pg (ref 26.0–34.0)
MCHC: 33 g/dL (ref 30.0–36.0)
MCHC: 32.3 g/dL (ref 30.0–36.0)
MCV: 93.3 fL (ref 80.0–100.0)
MCV: 93.4 fL (ref 80.0–100.0)
Platelets: 116 10*3/uL — ABNORMAL LOW (ref 150–400)
Platelets: 100 10*3/uL — ABNORMAL LOW (ref 150–400)
RBC: 3.28 MIL/uL — ABNORMAL LOW (ref 4.22–5.81)
RBC: 2.88 MIL/uL — ABNORMAL LOW (ref 4.22–5.81)
RDW: 13.3 % (ref 11.5–15.5)
RDW: 13.3 % (ref 11.5–15.5)
WBC: 18 10*3/uL — ABNORMAL HIGH (ref 4.0–10.5)
WBC: 15 10*3/uL — ABNORMAL HIGH (ref 4.0–10.5)
nRBC: 0 % (ref 0.0–0.2)
nRBC: 0 % (ref 0.0–0.2)

## 2020-11-03 LAB — GLUCOSE, CAPILLARY
Glucose-Capillary: 162 mg/dL — ABNORMAL HIGH (ref 70–99)
Glucose-Capillary: 172 mg/dL — ABNORMAL HIGH (ref 70–99)
Glucose-Capillary: 169 mg/dL — ABNORMAL HIGH (ref 70–99)
Glucose-Capillary: 146 mg/dL — ABNORMAL HIGH (ref 70–99)
Glucose-Capillary: 115 mg/dL — ABNORMAL HIGH (ref 70–99)
Glucose-Capillary: 159 mg/dL — ABNORMAL HIGH (ref 70–99)
Glucose-Capillary: 130 mg/dL — ABNORMAL HIGH (ref 70–99)

## 2020-11-03 LAB — ABO/RH: ABO/RH(D): A POS

## 2020-11-03 LAB — POCT I-STAT EG7
Acid-Base Excess: 2 mmol/L (ref 0.0–2.0)
Bicarbonate: 27.6 mmol/L (ref 20.0–28.0)
Calcium, Ion: 1.09 mmol/L — ABNORMAL LOW (ref 1.15–1.40)
HCT: 30 % — ABNORMAL LOW (ref 39.0–52.0)
Hemoglobin: 10.2 g/dL — ABNORMAL LOW (ref 13.0–17.0)
O2 Saturation: 100 %
Potassium: 4.6 mmol/L (ref 3.5–5.1)
Sodium: 140 mmol/L (ref 135–145)
TCO2: 29 mmol/L (ref 22–32)
pCO2, Ven: 48.2 mmHg (ref 44.0–60.0)
pH, Ven: 7.365 (ref 7.250–7.430)
pO2, Ven: 252 mmHg — ABNORMAL HIGH (ref 32.0–45.0)

## 2020-11-03 LAB — COOXEMETRY PANEL
Carboxyhemoglobin: 0.9 % (ref 0.5–1.5)
Methemoglobin: 1.2 % (ref 0.0–1.5)
O2 Saturation: 98.2 %
Total hemoglobin: 9.2 g/dL — ABNORMAL LOW (ref 12.0–16.0)

## 2020-11-03 LAB — PROTIME-INR
INR: 1.7 — ABNORMAL HIGH (ref 0.8–1.2)
Prothrombin Time: 19.8 seconds — ABNORMAL HIGH (ref 11.4–15.2)

## 2020-11-03 LAB — APTT: aPTT: 33 seconds (ref 24–36)

## 2020-11-03 LAB — BASIC METABOLIC PANEL
Anion gap: 6 (ref 5–15)
BUN: 15 mg/dL (ref 8–23)
CO2: 23 mmol/L (ref 22–32)
Calcium: 7.9 mg/dL — ABNORMAL LOW (ref 8.9–10.3)
Chloride: 108 mmol/L (ref 98–111)
Creatinine, Ser: 0.9 mg/dL (ref 0.61–1.24)
GFR, Estimated: >60 mL/min (ref >60)
Glucose, Bld: 147 mg/dL — ABNORMAL HIGH (ref 70–99)
Potassium: 4 mmol/L (ref 3.5–5.1)
Sodium: 137 mmol/L (ref 135–145)

## 2020-11-03 LAB — PLATELET COUNT: Platelets: 144 10*3/uL — ABNORMAL LOW (ref 150–400)

## 2020-11-03 LAB — FIBRINOGEN: Fibrinogen: 192 mg/dL — ABNORMAL LOW (ref 210–475)

## 2020-11-03 LAB — HEMOGLOBIN AND HEMATOCRIT, BLOOD
HCT: 28.8 % — ABNORMAL LOW (ref 39.0–52.0)
Hemoglobin: 9.8 g/dL — ABNORMAL LOW (ref 13.0–17.0)

## 2020-11-03 LAB — MAGNESIUM: Magnesium: 2.8 mg/dL — ABNORMAL HIGH (ref 1.7–2.4)

## 2020-11-03 SURGERY — REPAIR, ANEURYSM, AORTA, THORACIC, ASCENDING
Anesthesia: General

## 2020-11-03 MED ORDER — CHLORHEXIDINE GLUCONATE 0.12 % MT SOLN
15.0000 mL | OROMUCOSAL | Status: AC
  Administered 2020-11-03: 15 mL via OROMUCOSAL

## 2020-11-03 MED ORDER — ALBUMIN HUMAN 25 % IV SOLN
12.5000 g | INTRAVENOUS | Status: DC | PRN
  Administered 2020-11-03 (×2): 12.5 g via INTRAVENOUS

## 2020-11-03 MED ORDER — PANTOPRAZOLE SODIUM 40 MG PO TBEC
40.0000 mg | DELAYED_RELEASE_TABLET | Freq: Every day | ORAL | Status: DC
  Administered 2020-11-05 – 2020-11-09 (×5): 40 mg via ORAL
  Filled 2020-11-03 (×5): qty 1

## 2020-11-03 MED ORDER — LIDOCAINE 2% (20 MG/ML) 5 ML SYRINGE
INTRAMUSCULAR | Status: DC | PRN
  Administered 2020-11-03: 100 mg via INTRAVENOUS

## 2020-11-03 MED ORDER — PLASMA-LYTE A IV SOLN
INTRAVENOUS | Status: DC | PRN
  Administered 2020-11-03: 1000 mL

## 2020-11-03 MED ORDER — ROCURONIUM BROMIDE 10 MG/ML (PF) SYRINGE
PREFILLED_SYRINGE | INTRAVENOUS | Status: AC
  Filled 2020-11-03: qty 10

## 2020-11-03 MED ORDER — PHENYLEPHRINE 40 MCG/ML (10ML) SYRINGE FOR IV PUSH (FOR BLOOD PRESSURE SUPPORT)
PREFILLED_SYRINGE | INTRAVENOUS | Status: AC
  Filled 2020-11-03: qty 10

## 2020-11-03 MED ORDER — ROCURONIUM BROMIDE 10 MG/ML (PF) SYRINGE
PREFILLED_SYRINGE | INTRAVENOUS | Status: DC | PRN
  Administered 2020-11-03 (×5): 50 mg via INTRAVENOUS
  Administered 2020-11-03: 100 mg via INTRAVENOUS
  Administered 2020-11-03: 50 mg via INTRAVENOUS

## 2020-11-03 MED ORDER — ALBUMIN HUMAN 5 % IV SOLN: INTRAVENOUS | Status: DC | PRN

## 2020-11-03 MED ORDER — SODIUM CHLORIDE 0.9 % IV SOLN: 250.0000 mL | INTRAVENOUS | Status: DC

## 2020-11-03 MED ORDER — EPINEPHRINE 1 MG/10ML IJ SOSY
PREFILLED_SYRINGE | INTRAMUSCULAR | Status: AC
  Filled 2020-11-03: qty 10

## 2020-11-03 MED ORDER — CHLORHEXIDINE GLUCONATE CLOTH 2 % EX PADS
6.0000 | MEDICATED_PAD | Freq: Every day | CUTANEOUS | Status: DC
  Administered 2020-11-03 – 2020-11-09 (×6): 6 via TOPICAL

## 2020-11-03 MED ORDER — TRAMADOL HCL 50 MG PO TABS
50.0000 mg | ORAL_TABLET | ORAL | Status: DC | PRN
  Administered 2020-11-04 (×2): 50 mg via ORAL
  Filled 2020-11-03 (×2): qty 1

## 2020-11-03 MED ORDER — FENTANYL CITRATE (PF) 250 MCG/5ML IJ SOLN
INTRAMUSCULAR | Status: DC | PRN
  Administered 2020-11-03: 100 ug via INTRAVENOUS
  Administered 2020-11-03: 50 ug via INTRAVENOUS
  Administered 2020-11-03: 100 ug via INTRAVENOUS
  Administered 2020-11-03 (×2): 150 ug via INTRAVENOUS
  Administered 2020-11-03: 100 ug via INTRAVENOUS
  Administered 2020-11-03: 50 ug via INTRAVENOUS
  Administered 2020-11-03: 150 ug via INTRAVENOUS
  Administered 2020-11-03: 100 ug via INTRAVENOUS
  Administered 2020-11-03: 50 ug via INTRAVENOUS

## 2020-11-03 MED ORDER — LEVOFLOXACIN IN D5W 750 MG/150ML IV SOLN
750.0000 mg | INTRAVENOUS | Status: AC
  Administered 2020-11-04: 750 mg via INTRAVENOUS
  Filled 2020-11-03: qty 150

## 2020-11-03 MED ORDER — NITROGLYCERIN IN D5W 200-5 MCG/ML-% IV SOLN
0.0000 ug/min | INTRAVENOUS | Status: DC
Start: 2020-11-03 — End: 2020-11-05

## 2020-11-03 MED ORDER — ACETAMINOPHEN 500 MG PO TABS
1000.0000 mg | ORAL_TABLET | Freq: Four times a day (QID) | ORAL | Status: AC
  Administered 2020-11-04 – 2020-11-08 (×15): 1000 mg via ORAL
  Filled 2020-11-03 (×18): qty 2

## 2020-11-03 MED ORDER — DEXTROSE 50 % IV SOLN: 0.0000 mL | INTRAVENOUS | Status: DC | PRN

## 2020-11-03 MED ORDER — HEMOSTATIC AGENTS (NO CHARGE) OPTIME
TOPICAL | Status: DC | PRN
  Administered 2020-11-03 (×4): 1 via TOPICAL
  Administered 2020-11-03: 4 via TOPICAL

## 2020-11-03 MED ORDER — INSULIN REGULAR(HUMAN) IN NACL 100-0.9 UT/100ML-% IV SOLN: INTRAVENOUS | Status: DC

## 2020-11-03 MED ORDER — 0.9 % SODIUM CHLORIDE (POUR BTL) OPTIME
TOPICAL | Status: DC | PRN
  Administered 2020-11-03: 5000 mL

## 2020-11-03 MED ORDER — METOPROLOL TARTRATE 5 MG/5ML IV SOLN: 2.5000 mg | INTRAVENOUS | Status: DC | PRN

## 2020-11-03 MED ORDER — HEPARIN SODIUM (PORCINE) 1000 UNIT/ML IJ SOLN
INTRAMUSCULAR | Status: AC
  Filled 2020-11-03: qty 1

## 2020-11-03 MED ORDER — SODIUM CHLORIDE (PF) 0.9 % IJ SOLN
INTRAMUSCULAR | Status: AC
  Filled 2020-11-03: qty 10

## 2020-11-03 MED ORDER — METOPROLOL TARTRATE 12.5 MG HALF TABLET: 12.5000 mg | ORAL_TABLET | Freq: Two times a day (BID) | ORAL | Status: DC

## 2020-11-03 MED ORDER — MILRINONE LACTATE IN DEXTROSE 20-5 MG/100ML-% IV SOLN
0.2500 ug/kg/min | INTRAVENOUS | Status: DC
  Administered 2020-11-04: 0.25 ug/kg/min via INTRAVENOUS
  Filled 2020-11-03: qty 100

## 2020-11-03 MED ORDER — LACTATED RINGERS IV SOLN: INTRAVENOUS | Status: DC

## 2020-11-03 MED ORDER — ~~LOC~~ CARDIAC SURGERY, PATIENT & FAMILY EDUCATION
Freq: Once | Status: DC
  Filled 2020-11-03: qty 1

## 2020-11-03 MED ORDER — SODIUM CHLORIDE 0.9 % IV SOLN: INTRAVENOUS | Status: DC

## 2020-11-03 MED ORDER — FAMOTIDINE IN NACL 20-0.9 MG/50ML-% IV SOLN
20.0000 mg | Freq: Two times a day (BID) | INTRAVENOUS | Status: AC
  Administered 2020-11-03 (×2): 20 mg via INTRAVENOUS
  Filled 2020-11-03 (×2): qty 50

## 2020-11-03 MED ORDER — HEPARIN SODIUM (PORCINE) 1000 UNIT/ML IJ SOLN
INTRAMUSCULAR | Status: DC | PRN
  Administered 2020-11-03: 20000 [IU] via INTRAVENOUS
  Administered 2020-11-03: 5000 [IU] via INTRAVENOUS

## 2020-11-03 MED ORDER — OXYCODONE HCL 5 MG PO TABS: 5.0000 mg | ORAL_TABLET | ORAL | Status: DC | PRN

## 2020-11-03 MED ORDER — SODIUM CHLORIDE 0.9% FLUSH
3.0000 mL | Freq: Two times a day (BID) | INTRAVENOUS | Status: DC
  Administered 2020-11-04 – 2020-11-05 (×2): 3 mL via INTRAVENOUS

## 2020-11-03 MED ORDER — MIDAZOLAM HCL (PF) 5 MG/ML IJ SOLN
INTRAMUSCULAR | Status: DC | PRN
  Administered 2020-11-03 (×2): 1 mg via INTRAVENOUS
  Administered 2020-11-03: 5 mg via INTRAVENOUS

## 2020-11-03 MED ORDER — VANCOMYCIN HCL IN DEXTROSE 1-5 GM/200ML-% IV SOLN
1000.0000 mg | Freq: Once | INTRAVENOUS | Status: AC
  Administered 2020-11-03: 1000 mg via INTRAVENOUS
  Filled 2020-11-03 (×2): qty 200

## 2020-11-03 MED ORDER — FENTANYL CITRATE (PF) 250 MCG/5ML IJ SOLN
INTRAMUSCULAR | Status: AC
  Filled 2020-11-03: qty 25

## 2020-11-03 MED ORDER — CALCIUM CHLORIDE 10 % IV SOLN
INTRAVENOUS | Status: DC | PRN
  Administered 2020-11-03: 300 mg via INTRAVENOUS
  Administered 2020-11-03: 200 mg via INTRAVENOUS
  Administered 2020-11-03: 300 mg via INTRAVENOUS
  Administered 2020-11-03: 200 mg via INTRAVENOUS
  Administered 2020-11-03 (×2): 300 mg via INTRAVENOUS

## 2020-11-03 MED ORDER — POTASSIUM CHLORIDE 10 MEQ/50ML IV SOLN
10.0000 meq | INTRAVENOUS | Status: AC
Start: 2020-11-03 — End: 2020-11-03
  Administered 2020-11-03 (×3): 10 meq via INTRAVENOUS

## 2020-11-03 MED ORDER — DEXMEDETOMIDINE HCL IN NACL 400 MCG/100ML IV SOLN
0.0000 ug/kg/h | INTRAVENOUS | Status: DC
  Administered 2020-11-03: 0.5 ug/kg/h via INTRAVENOUS
  Filled 2020-11-03: qty 100

## 2020-11-03 MED ORDER — ACETAMINOPHEN 160 MG/5ML PO SOLN: 650.0000 mg | Freq: Once | ORAL | Status: AC

## 2020-11-03 MED ORDER — MIDAZOLAM HCL (PF) 10 MG/2ML IJ SOLN
INTRAMUSCULAR | Status: AC
  Filled 2020-11-03: qty 2

## 2020-11-03 MED ORDER — MAGNESIUM SULFATE 4 GM/100ML IV SOLN
4.0000 g | Freq: Once | INTRAVENOUS | Status: AC
  Administered 2020-11-03: 4 g via INTRAVENOUS
  Filled 2020-11-03: qty 100

## 2020-11-03 MED ORDER — ONDANSETRON HCL 4 MG/2ML IJ SOLN
4.0000 mg | Freq: Four times a day (QID) | INTRAMUSCULAR | Status: DC | PRN
Start: 2020-11-03 — End: 2020-11-09

## 2020-11-03 MED ORDER — PROPOFOL 10 MG/ML IV BOLUS
INTRAVENOUS | Status: DC | PRN
  Administered 2020-11-03 (×2): 100 mg via INTRAVENOUS

## 2020-11-03 MED ORDER — LACTATED RINGERS IV SOLN: INTRAVENOUS | Status: DC | PRN

## 2020-11-03 MED ORDER — PROTAMINE SULFATE 10 MG/ML IV SOLN
INTRAVENOUS | Status: AC
  Filled 2020-11-03: qty 25

## 2020-11-03 MED ORDER — ASPIRIN EC 325 MG PO TBEC
325.0000 mg | DELAYED_RELEASE_TABLET | Freq: Every day | ORAL | Status: DC
  Administered 2020-11-04 – 2020-11-09 (×6): 325 mg via ORAL
  Filled 2020-11-03 (×6): qty 1

## 2020-11-03 MED ORDER — PROTAMINE SULFATE 10 MG/ML IV SOLN
INTRAVENOUS | Status: AC
  Filled 2020-11-03: qty 5

## 2020-11-03 MED ORDER — PHENYLEPHRINE 40 MCG/ML (10ML) SYRINGE FOR IV PUSH (FOR BLOOD PRESSURE SUPPORT)
PREFILLED_SYRINGE | INTRAVENOUS | Status: DC | PRN
  Administered 2020-11-03 (×5): 40 ug via INTRAVENOUS

## 2020-11-03 MED ORDER — LIDOCAINE 2% (20 MG/ML) 5 ML SYRINGE
INTRAMUSCULAR | Status: AC
  Filled 2020-11-03: qty 5

## 2020-11-03 MED ORDER — SODIUM CHLORIDE 0.9 % IV BOLUS
250.0000 mL | INTRAVENOUS | Status: DC | PRN
  Administered 2020-11-03 (×2): 250 mL via INTRAVENOUS

## 2020-11-03 MED ORDER — SUCCINYLCHOLINE CHLORIDE 200 MG/10ML IV SOSY
PREFILLED_SYRINGE | INTRAVENOUS | Status: AC
  Filled 2020-11-03: qty 10

## 2020-11-03 MED ORDER — PROTAMINE SULFATE 10 MG/ML IV SOLN
INTRAVENOUS | Status: DC | PRN
Start: 2020-11-03 — End: 2020-11-03
  Administered 2020-11-03: 150 mg via INTRAVENOUS
  Administered 2020-11-03: 50 mg via INTRAVENOUS
  Administered 2020-11-03: 20 mg via INTRAVENOUS
  Administered 2020-11-03: 30 mg via INTRAVENOUS

## 2020-11-03 MED ORDER — LACTATED RINGERS IV SOLN
INTRAVENOUS | Status: DC | PRN
Start: 2020-11-03 — End: 2020-11-03

## 2020-11-03 MED ORDER — HYDROCORTISONE SOD SUC (PF) 100 MG IJ SOLR
INTRAMUSCULAR | Status: DC | PRN
  Administered 2020-11-03: 125 mg via INTRAVENOUS

## 2020-11-03 MED ORDER — BISACODYL 5 MG PO TBEC
10.0000 mg | DELAYED_RELEASE_TABLET | Freq: Every day | ORAL | Status: DC
  Administered 2020-11-04 – 2020-11-06 (×3): 10 mg via ORAL
  Filled 2020-11-03 (×4): qty 2

## 2020-11-03 MED ORDER — ALBUMIN HUMAN 5 % IV SOLN: 250.0000 mL | INTRAVENOUS | Status: DC | PRN

## 2020-11-03 MED ORDER — ACETAMINOPHEN 650 MG RE SUPP
650.0000 mg | Freq: Once | RECTAL | Status: AC
  Administered 2020-11-03: 650 mg via RECTAL

## 2020-11-03 MED ORDER — MIDAZOLAM HCL 2 MG/2ML IJ SOLN
2.0000 mg | INTRAMUSCULAR | Status: DC | PRN
  Administered 2020-11-03: 2 mg via INTRAVENOUS
  Filled 2020-11-03: qty 2

## 2020-11-03 MED ORDER — DOCUSATE SODIUM 100 MG PO CAPS
200.0000 mg | ORAL_CAPSULE | Freq: Every day | ORAL | Status: DC
  Administered 2020-11-04 – 2020-11-07 (×4): 200 mg via ORAL
  Filled 2020-11-03 (×4): qty 2

## 2020-11-03 MED ORDER — ASPIRIN 81 MG PO CHEW
324.0000 mg | CHEWABLE_TABLET | Freq: Every day | ORAL | Status: DC
  Filled 2020-11-03: qty 4

## 2020-11-03 MED ORDER — CALCIUM CHLORIDE 10 % IV SOLN
INTRAVENOUS | Status: AC
  Filled 2020-11-03: qty 20

## 2020-11-03 MED ORDER — PHENYLEPHRINE HCL-NACL 20-0.9 MG/250ML-% IV SOLN
0.0000 ug/min | INTRAVENOUS | Status: DC
  Administered 2020-11-03: 30 ug/min via INTRAVENOUS
  Filled 2020-11-03 (×2): qty 250

## 2020-11-03 MED ORDER — EPINEPHRINE 1 MG/10ML IJ SOSY
PREFILLED_SYRINGE | INTRAMUSCULAR | Status: DC | PRN
  Administered 2020-11-03 (×2): 10 ug via INTRAVENOUS

## 2020-11-03 MED ORDER — MORPHINE SULFATE (PF) 2 MG/ML IV SOLN
1.0000 mg | INTRAVENOUS | Status: DC | PRN
  Administered 2020-11-03: 2 mg via INTRAVENOUS
  Filled 2020-11-03: qty 1

## 2020-11-03 MED ORDER — ARTIFICIAL TEARS OPHTHALMIC OINT
TOPICAL_OINTMENT | OPHTHALMIC | Status: DC | PRN
Start: 2020-11-03 — End: 2020-11-03
  Administered 2020-11-03: 1 via OPHTHALMIC

## 2020-11-03 MED ORDER — SODIUM CHLORIDE 0.45 % IV SOLN: INTRAVENOUS | Status: DC | PRN

## 2020-11-03 MED ORDER — PROPOFOL 10 MG/ML IV BOLUS
INTRAVENOUS | Status: AC
  Filled 2020-11-03: qty 20

## 2020-11-03 MED ORDER — CHLORHEXIDINE GLUCONATE 0.12 % MT SOLN
15.0000 mL | Freq: Once | OROMUCOSAL | Status: AC
  Administered 2020-11-03: 15 mL via OROMUCOSAL
  Filled 2020-11-03: qty 15

## 2020-11-03 MED ORDER — METOPROLOL TARTRATE 25 MG/10 ML ORAL SUSPENSION
12.5000 mg | Freq: Two times a day (BID) | ORAL | Status: DC
  Administered 2020-11-03: 12.5 mg
  Filled 2020-11-03: qty 5

## 2020-11-03 MED ORDER — SODIUM CHLORIDE 0.9% FLUSH: 3.0000 mL | INTRAVENOUS | Status: DC | PRN

## 2020-11-03 MED ORDER — ACETAMINOPHEN 160 MG/5ML PO SOLN
1000.0000 mg | Freq: Four times a day (QID) | ORAL | Status: AC
  Administered 2020-11-06 (×2): 1000 mg
  Filled 2020-11-03 (×2): qty 40.6

## 2020-11-03 MED ORDER — ARTIFICIAL TEARS OPHTHALMIC OINT
TOPICAL_OINTMENT | OPHTHALMIC | Status: AC
  Filled 2020-11-03: qty 3.5

## 2020-11-03 MED ORDER — DEXMEDETOMIDINE HCL IN NACL 400 MCG/100ML IV SOLN
0.1000 ug/kg/h | INTRAVENOUS | Status: DC
  Filled 2020-11-03: qty 100

## 2020-11-03 MED ORDER — BISACODYL 10 MG RE SUPP: 10.0000 mg | Freq: Every day | RECTAL | Status: DC

## 2020-11-03 MED ORDER — EPINEPHRINE HCL 5 MG/250ML IV SOLN IN NS: 0.5000 ug/min | INTRAVENOUS | Status: DC

## 2020-11-03 MED ORDER — LACTATED RINGERS IV SOLN: 500.0000 mL | Freq: Once | INTRAVENOUS | Status: DC | PRN

## 2020-11-03 MED ORDER — CHLORHEXIDINE GLUCONATE 4 % EX LIQD: 30.0000 mL | CUTANEOUS | Status: DC

## 2020-11-03 MED ORDER — NOREPINEPHRINE 4 MG/250ML-% IV SOLN
0.0000 ug/min | INTRAVENOUS | Status: DC
  Administered 2020-11-03: 8 ug/min via INTRAVENOUS
  Filled 2020-11-03 (×2): qty 250

## 2020-11-03 MED ORDER — METOPROLOL TARTRATE 12.5 MG HALF TABLET: 12.5000 mg | ORAL_TABLET | Freq: Once | ORAL | Status: DC

## 2020-11-03 MED ORDER — SODIUM CHLORIDE (PF) 0.9 % IJ SOLN
OROMUCOSAL | Status: DC | PRN
  Administered 2020-11-03: 12 mL via TOPICAL

## 2020-11-03 SURGICAL SUPPLY — 85 items
ADAPTER CARDIO PERF ANTE/RETRO (ADAPTER) ×3 IMPLANT
ADPR PRFSN 84XANTGRD RTRGD (ADAPTER) ×1
BAG DECANTER FOR FLEXI CONT (MISCELLANEOUS) ×3 IMPLANT
BLADE CLIPPER SURG (BLADE) ×3 IMPLANT
BLADE STERNUM SYSTEM 6 (BLADE) ×3 IMPLANT
CANISTER SUCT 3000ML PPV (MISCELLANEOUS) ×3 IMPLANT
CANNULA GUNDRY RCSP 15FR (MISCELLANEOUS) ×2 IMPLANT
CANNULA SUMP PERICARDIAL (CANNULA) ×2 IMPLANT
CATH CPB KIT HENDRICKSON (MISCELLANEOUS) ×2 IMPLANT
CATH HEART VENT LEFT (CATHETERS) IMPLANT
CATH THORACIC 36FR RT ANG (CATHETERS) ×2 IMPLANT
CAUTERY EYE LOW TEMP 1300F FIN (OPHTHALMIC RELATED) ×3 IMPLANT
CAUTERY SURG HI TEMP FINE TIP (MISCELLANEOUS) ×2 IMPLANT
CONN ST 1/4X3/8  BEN (MISCELLANEOUS) ×6
CONN ST 1/4X3/8 BEN (MISCELLANEOUS) IMPLANT
CONT SPEC 4OZ CLIKSEAL STRL BL (MISCELLANEOUS) ×2 IMPLANT
DRAIN CHANNEL 28F RND 3/8 FF (WOUND CARE) ×4 IMPLANT
DRAPE CARDIOVASCULAR INCISE (DRAPES) ×3
DRAPE SRG 135X102X78XABS (DRAPES) ×1 IMPLANT
DRAPE WARM FLUID 44X44 (DRAPES) ×3 IMPLANT
DRSG COVADERM 4X14 (GAUZE/BANDAGES/DRESSINGS) ×3 IMPLANT
DRSG COVADERM 4X6 (GAUZE/BANDAGES/DRESSINGS) ×2 IMPLANT
ELECT REM PT RETURN 9FT ADLT (ELECTROSURGICAL) ×6
ELECTRODE REM PT RTRN 9FT ADLT (ELECTROSURGICAL) ×2 IMPLANT
GAUZE SPONGE 4X4 12PLY STRL (GAUZE/BANDAGES/DRESSINGS) ×6 IMPLANT
GAUZE SPONGE 4X4 12PLY STRL LF (GAUZE/BANDAGES/DRESSINGS) ×4 IMPLANT
GOWN STRL REUS W/ TWL LRG LVL3 (GOWN DISPOSABLE) ×10 IMPLANT
GOWN STRL REUS W/TWL LRG LVL3 (GOWN DISPOSABLE) ×30
GRAFT 4 BRANCH 32X50 (Prosthesis & Implant Heart) ×2 IMPLANT
GRAFT CV 30X8WVN NDL (Graft) IMPLANT
GRAFT HEMASHIELD 8MM (Graft) ×3 IMPLANT
HANDLE STAPLE ENDO GIA SHORT (STAPLE) ×2
HEMOSTAT SURGICEL 2X14 (HEMOSTASIS) ×4 IMPLANT
INSERT FOGARTY SM (MISCELLANEOUS) ×3 IMPLANT
INSERT FOGARTY XLG (MISCELLANEOUS) ×2 IMPLANT
KIT BASIN OR (CUSTOM PROCEDURE TRAY) ×3 IMPLANT
KIT SUCTION CATH 14FR (SUCTIONS) ×2 IMPLANT
KIT TURNOVER KIT B (KITS) ×3 IMPLANT
LINE VENT (MISCELLANEOUS) ×2 IMPLANT
LOOP VESSEL SUPERMAXI WHITE (MISCELLANEOUS) ×2 IMPLANT
NDL AORTIC AIR ASPIRATING (NEEDLE) IMPLANT
NEEDLE AORTIC AIR ASPIRATING (NEEDLE) ×3 IMPLANT
NS IRRIG 1000ML POUR BTL (IV SOLUTION) ×16 IMPLANT
PACK E OPEN HEART (SUTURE) ×3 IMPLANT
PACK OPEN HEART (CUSTOM PROCEDURE TRAY) ×3 IMPLANT
PAD ARMBOARD 7.5X6 YLW CONV (MISCELLANEOUS) ×6 IMPLANT
POSITIONER HEAD DONUT 9IN (MISCELLANEOUS) IMPLANT
RELOAD STAPLE 30 PURP MED/THCK (STAPLE) IMPLANT
RELOAD TRI 2.0 30 MED THCK SUL (STAPLE) ×6 IMPLANT
SEALANT PATCH FIBRIN 2X4IN (MISCELLANEOUS) ×8 IMPLANT
SET MPS 3-ND DEL (MISCELLANEOUS) ×2 IMPLANT
SPONGE T-LAP 18X18 ~~LOC~~+RFID (SPONGE) IMPLANT
SPONGE T-LAP 4X18 ~~LOC~~+RFID (SPONGE) ×2 IMPLANT
STAPLER ENDO GIA 12 SHRT THIN (STAPLE) IMPLANT
STAPLER ENDO GIA 12MM SHORT (STAPLE) ×1 IMPLANT
STOPCOCK 4 WAY LG BORE MALE ST (IV SETS) IMPLANT
SUT PROLENE 3 0 RB 1 (SUTURE) ×3 IMPLANT
SUT PROLENE 3 0 SH 48 (SUTURE) ×4 IMPLANT
SUT PROLENE 3 0 SH DA (SUTURE) ×5 IMPLANT
SUT PROLENE 4 0 RB 1 (SUTURE) ×30
SUT PROLENE 4 0 SH DA (SUTURE) ×12 IMPLANT
SUT PROLENE 4-0 RB1 .5 CRCL 36 (SUTURE) IMPLANT
SUT PROLENE 5 0 C 1 36 (SUTURE) ×22 IMPLANT
SUT SILK  1 MH (SUTURE) ×3
SUT SILK 1 MH (SUTURE) IMPLANT
SUT STEEL 6MS V (SUTURE) ×4 IMPLANT
SUT VIC AB 1 CTX 27 (SUTURE) ×6 IMPLANT
SUT VIC AB 2-0 CT1 27 (SUTURE) ×6
SUT VIC AB 2-0 CT1 TAPERPNT 27 (SUTURE) IMPLANT
SUT VIC AB 3-0 SH 27 (SUTURE)
SUT VIC AB 3-0 SH 27X BRD (SUTURE) IMPLANT
SUT VIC AB 3-0 X1 27 (SUTURE) ×2 IMPLANT
SYSTEM SAHARA CHEST DRAIN ATS (WOUND CARE) ×3 IMPLANT
TAPE CLOTH SURG 4X10 WHT LF (GAUZE/BANDAGES/DRESSINGS) ×2 IMPLANT
TAPE PAPER 2X10 WHT MICROPORE (GAUZE/BANDAGES/DRESSINGS) ×2 IMPLANT
TOWEL GREEN STERILE (TOWEL DISPOSABLE) ×3 IMPLANT
TOWEL GREEN STERILE FF (TOWEL DISPOSABLE) ×3 IMPLANT
TRAY CATH LUMEN 1 20CM STRL (SET/KITS/TRAYS/PACK) IMPLANT
TRAY FOLEY SLVR 14FR TEMP STAT (SET/KITS/TRAYS/PACK) ×3 IMPLANT
TRAY FOLEY SLVR 16FR TEMP STAT (SET/KITS/TRAYS/PACK) ×2 IMPLANT
TUBE CONNECTING 20'X1/4 (TUBING)
TUBE CONNECTING 20X1/4 (TUBING) IMPLANT
VENT LEFT HEART 12002 (CATHETERS) ×3
WATER STERILE IRR 1000ML POUR (IV SOLUTION) ×6 IMPLANT
YANKAUER SUCT BULB TIP NO VENT (SUCTIONS) IMPLANT

## 2020-11-03 NOTE — Anesthesia Procedure Notes (Signed)
Procedure Name: Intubation Date/Time: 11/03/2020 7:50 AM Performed by: Harden Mo, CRNA Pre-anesthesia Checklist: Patient identified, Emergency Drugs available, Suction available and Patient being monitored Patient Re-evaluated:Patient Re-evaluated prior to induction Oxygen Delivery Method: Circle System Utilized Preoxygenation: Pre-oxygenation with 100% oxygen Induction Type: IV induction Ventilation: Mask ventilation without difficulty and Oral airway inserted - appropriate to patient size Laryngoscope Size: Sabra Heck and 2 Grade View: Grade II Tube type: Oral Tube size: 8.0 mm Number of attempts: 1 Airway Equipment and Method: Stylet and Oral airway Placement Confirmation: ETT inserted through vocal cords under direct vision, positive ETCO2 and breath sounds checked- equal and bilateral Secured at: 23 cm Tube secured with: Tape Dental Injury: Teeth and Oropharynx as per pre-operative assessment

## 2020-11-03 NOTE — Progress Notes (Signed)
EVENING ROUNDS NOTE :     [address]       [place] [address]             [phone]                 Day of Surgery Procedure(s) (LRB): THORACIC ASCENDING ANEURYSM REPAIR (AAA) USING HEMASHIELD PLATINUM 32MM GRAFT (N/A) TRANSESOPHAGEAL ECHOCARDIOGRAM (TEE) (N/A)   Total Length of Stay:  LOS: 0 days  Events:   Labile BP Low CT output Transfusing FFP    BP (!) 173/53   Pulse (!) 49   Temp 98.1 F (36.7 C) (Oral)   Resp 15   Ht 6\' 2"  (1.88 m)   Wt 70.8 kg   SpO2 100%   BMI 20.03 kg/m   PAP: (4-11)/(-6-2) 9/-6  Vent Mode: SIMV;PRVC FiO2 (%):  [50 %] 50 % Set Rate:  [12 bmp] 12 bmp Vt Set:  [700 mL] 700 mL PEEP:  [5 cmH20] 5 cmH20 Pressure Support:  [10 cmH20] 10 cmH20   sodium chloride     [START ON 11/04/2020] sodium chloride     sodium chloride     albumin human     And   sodium chloride     dexmedetomidine (PRECEDEX) IV infusion     famotidine (PEPCID) IV     insulin     lactated ringers     lactated ringers     lactated ringers     [START ON 11/04/2020] levofloxacin (LEVAQUIN) IV     magnesium sulfate     nitroGLYCERIN     phenylephrine (NEO-SYNEPHRINE) Adult infusion     potassium chloride     vancomycin      No intake/output data recorded.   CBC Latest Ref Rng & Units 11/03/2020 11/03/2020 11/03/2020  WBC 4.0 - 10.5 K/uL - - -  Hemoglobin 13.0 - 17.0 g/dL 8.2(L) 7.5(L) 8.2(L)  Hematocrit 39.0 - 52.0 % 24.0(L) 22.0(L) 24.0(L)  Platelets 150 - 400 K/uL - - -    BMP Latest Ref Rng & Units 11/03/2020 11/03/2020 11/03/2020  Glucose 70 - 99 mg/dL - 128(H) -  BUN 8 - 23 mg/dL - 13 -  Creatinine 0.61 - 1.24 mg/dL - 0.70 -  BUN/Creat Ratio 10 - 24 - - -  Sodium 135 - 145 mmol/L 143 141 142  Potassium 3.5 - 5.1 mmol/L 4.0 4.0 4.7  Chloride 98 - 111 mmol/L - 109 -  CO2 22 - 32 mmol/L - - -  Calcium 8.9 - 10.3 mg/dL - - -    ABG    Component Value Date/Time   PHART 7.408 11/03/2020 1422   PCO2ART 39.0 11/03/2020 1422    PO2ART 139 (H) 11/03/2020 1422   HCO3 24.6 11/03/2020 1422   TCO2 26 11/03/2020 1422   O2SAT 99.0 11/03/2020 [place], MD 11/03/2020 4:32 PM

## 2020-11-03 NOTE — Hospital Course (Addendum)
HPI:   Justin Contreras is a 80 year old man with a history of prostate cancer with recurrence, left lower lobectomy for bronchiectasis, and hyperlipidemia.  He is followed for prostate cancer by Dr. Diona Fanti.  He had an elevated PSA.  That led to a PET/CT which showed recurrence of his prostate cancer but also showed an ascending thoracic aortic aneurysm.  He then had a CT of the chest, abdomen and pelvis which showed the aneurysm was approximately 5.7 cm in diameter.   Is not having any chest pain but does complain of general malaise, weight loss, and some shortness of breath with exertion.   Dr Roxan Hockey saw him on 10/01/2020 and we discussed possible surgical repair.  He needed an echocardiogram and a cardiac catheterization.  The studies were reviewed by Dr. Roxan Hockey and he was found to have no significant coronary disease.  His ejection fraction was 45% with some mild global hypokinesis, mild AI and MR.  A discussion was undertaken in terms of medical management and observation versus surgical repair and ultimately a decision was made to proceed with surgery.  He was admitted this hospitalization electively for the procedure.  Hospital course:  On 11/03/2020 he was admitted electively and taken the OR.  He underwent repair of ascending thoracic aneurysm.  He tolerated the procedure well and was taken to the surgical intensive care unit in stable condition.  He was extubated the evening of surgery without difficulty.  He was weaned off Milrinone as hemodynamics allowed.  He was started on diuretics for volume overloaded state.  His chest tubes and arterial lines were removed without difficulty.  He had thrombocytopenia post op. Platelets went as low as 65,000. He also had expected post op blood loss anemia. He did not require a post op transfusion. He was weaned off the Insulin drip. His pre op HGA1C was 5.8. Accu checks and sliding scale were stopped. His last H and H was 9.0/26.2. He was felt  surgically stable for transfer from the ICU to 4E for further convalescence on 10/26. Patient became hypotensive and rapid response was called. He was given a bolus of fluid with improved BP. CBC was checked and H and H was slightly decreased to 7.4 and 22.4. He went into a fib with RVR and put on IV Amiodarone. He did convert to SR. He also had urinary retention and hematuria. He was in and out cath'd after bladder scan showed 403 ml. He has a history of prostate cancer and had already been started on Flomax. UA was checked and showed moderate blood.  He required placement of foley catheter which will be left in place at discharge with follow up with Urologist.  Echo was ordered 10/26 and was taken this am. It showed LVEF 35-40%, gLV showed global hypokinesis, trivial pericardial effusion. Platelets increased to 78,000 on 10/27. CXR 10/27 showed small left effusion.  Patient continued to remain stable.  He did have some mild ST changes felt to be related to pericarditis.  He was asymptomatic and colchicine was not initiated.  His surgical incisions were healing without evidence of infection.  He is medically stable for discharge.

## 2020-11-03 NOTE — Anesthesia Procedure Notes (Signed)
Arterial Line Insertion Start/End10/24/2022 7:00 AM, 11/03/2020 7:05 AM Performed by: Harden Mo, CRNA, CRNA  Patient location: Pre-op. Preanesthetic checklist: patient identified, IV checked, site marked, risks and benefits discussed, surgical consent, monitors and equipment checked, pre-op evaluation and anesthesia consent Lidocaine 1% used for infiltration Left, radial was placed Catheter size: 20 G Hand hygiene performed  and maximum sterile barriers used   Attempts: 1 Procedure performed without using ultrasound guided technique. Ultrasound Notes:anatomy identified, needle tip was noted to be adjacent to the nerve/plexus identified and no ultrasound evidence of intravascular and/or intraneural injection Following insertion, dressing applied. Post procedure assessment: normal and unchanged  Patient tolerated the procedure well with no immediate complications.

## 2020-11-03 NOTE — Interval H&P Note (Signed)
History and Physical Interval Note:  11/03/2020 7:17 AM  Justin Contreras  has presented today for surgery, with the diagnosis of TAA.  The various methods of treatment have been discussed with the patient and family. After consideration of risks, benefits and other options for treatment, the patient has consented to  Procedure(s) with comments: THORACIC ASCENDING ANEURYSM REPAIR (AAA) (N/A) - WITH CIRC ARREST TRANSESOPHAGEAL ECHOCARDIOGRAM (TEE) (N/A) as a surgical intervention.  The patient's history has been reviewed, patient examined, no change in status, stable for surgery.  I have reviewed the patient's chart and labs.  Questions were answered to the patient's satisfaction.     Melrose Nakayama

## 2020-11-03 NOTE — Brief Op Note (Signed)
11/03/2020  4:59 PM  PATIENT:  Justin Contreras  80 y.o. male  PRE-OPERATIVE DIAGNOSIS:  Ascending Thoracic Aortic Aneurysm  POST-OPERATIVE DIAGNOSIS:  Ascending Thoracic Aortic Aneurysm  PROCEDURE:  Procedure(s) with comments: THORACIC ASCENDING ANEURYSM REPAIR (AAA) USING HEMASHIELD PLATINUM 32MM GRAFT (N/A) - WITH CIRC ARREST TRANSESOPHAGEAL ECHOCARDIOGRAM (TEE) (N/A)  SURGEON:  Surgeon(s) and Role:    * Melrose Nakayama, MD - Primary  PHYSICIAN ASSISTANT: WAYNE GOLD PA-C  ASSISTANTS: STAFF   ANESTHESIA:   general  EBL:  1694 mL   BLOOD ADMINISTERED:none  DRAINS:  PLEURAL AND MEDIASTINAL CHEST TUBES    LOCAL MEDICATIONS USED:  NONE  SPECIMEN:  Source of Specimen:  AORTIC ANEURYSM  DISPOSITION OF SPECIMEN:  PATHOLOGY  COUNTS:  YES  TOURNIQUET:  * No tourniquets in log *  DICTATION: .PENDING  PLAN OF CARE: Admit to inpatient   PATIENT DISPOSITION:  ICU - intubated and hemodynamically stable.   Delay start of Pharmacological VTE agent (>24hrs) due to surgical blood loss or risk of bleeding: yes  CPMPLICATIONS: NO KNOWN

## 2020-11-03 NOTE — Anesthesia Postprocedure Evaluation (Signed)
Anesthesia Post Note  Patient: Justin Contreras  Procedure(s) Performed: THORACIC ASCENDING ANEURYSM REPAIR (AAA) USING HEMASHIELD PLATINUM 32MM GRAFT TRANSESOPHAGEAL ECHOCARDIOGRAM (TEE)     Patient location during evaluation: SICU Anesthesia Type: General Level of consciousness: sedated Pain management: pain level controlled Vital Signs Assessment: post-procedure vital signs reviewed and stable Respiratory status: patient remains intubated per anesthesia plan Cardiovascular status: stable Postop Assessment: no apparent nausea or vomiting Anesthetic complications: no   No notable events documented.  Last Vitals:  Vitals:   11/03/20 1725 11/03/20 1730  BP:    Pulse: 84 80  Resp: 12 12  Temp: (!) 35.3 C (!) 35.3 C  SpO2: 100% 100%    Last Pain:  Vitals:   11/03/20 1600  TempSrc: Core  PainSc:                  Daniele Yankowski DANIEL

## 2020-11-03 NOTE — Consult Note (Signed)
Urology Consult   Physician requesting consult: Erasmo Leventhal, MD  Reason for consult: Difficult Foley catheter placement  History of Present Illness: Justin Contreras is a 80 y.o. male with a history of prostate cancer treated with brachytherapy in 2000.  He is currently being followed by Dr. Diona Fanti and was recently found to have a significant PSA recurrence.  The patient underwent PET CT scan on 09/01/2020 and was found to have local recurrence of prostate cancer, but no lymphadenopathy or bony involvement.  He was also found to have a significant a sending aortic aneurysm.  He is currently under general anesthesia for repair of his aortic aneurysm with Dr. Roxan Hockey.  Prior to the initiation of surgery, the operative team attempted to place multiple Foley catheters, but met resistance in the proximal urethra.  Urology has been consulted for intraoperative Foley catheter placement.   Past Medical History:  Diagnosis Date   Bronchiectasis ([place])    Dyspnea    Hyperlipidemia    Hypertension    Pneumonia    Prostate cancer [facility])    Thoracic aortic aneurysm     Past Surgical History:  Procedure Laterality Date   KNEE ARTHROSCOPY Left    2014   LOBECTOMY Left    Left lower lobectomy   mohs micrographic surgery nose     partial remove lung     2008   prostate surgery/transperinal placement of needles     RIGHT/LEFT HEART CATH AND CORONARY ANGIOGRAPHY N/A 10/20/2020   Procedure: RIGHT/LEFT HEART CATH AND CORONARY ANGIOGRAPHY;  Surgeon: Nelva Bush, MD;  Location: [facility];  Service: Cardiovascular;  Laterality: N/A;    Current Hospital Medications:  Home Meds:  Current Meds  Medication Sig   aspirin EC 81 MG tablet Take 1 tablet (81 mg total) by mouth daily. Swallow whole.   carvedilol (COREG) 3.125 MG tablet Take 1 tablet (3.125 mg total) by mouth 2 (two) times daily.   lisinopril (ZESTRIL) 5 MG tablet Take 1 tablet (5 mg total) by mouth daily.   Polyethyl  Glycol-Propyl Glycol (SYSTANE OP) Place 1 drop into both eyes daily as needed (dry eyes).   tamsulosin (FLOMAX) 0.4 MG CAPS capsule Take 0.4 mg by mouth daily.    Scheduled Meds:  [START ON 11/04/2020] acetaminophen  1,000 mg Oral Q6H   Or   [START ON 11/04/2020] acetaminophen (TYLENOL) oral liquid 160 mg/5 mL  1,000 mg Per Tube Q6H   acetaminophen (TYLENOL) oral liquid 160 mg/5 mL  650 mg Per Tube Once   Or   acetaminophen  650 mg Rectal Once   [START ON 11/04/2020] aspirin EC  325 mg Oral Daily   Or   [START ON 11/04/2020] aspirin  324 mg Per Tube Daily   [START ON 11/04/2020] bisacodyl  10 mg Oral Daily   Or   [START ON 11/04/2020] bisacodyl  10 mg Rectal Daily   chlorhexidine  15 mL Mouth/Throat NOW   [START ON 11/04/2020] docusate sodium  200 mg Oral Daily   metoprolol tartrate  12.5 mg Oral BID   Or   metoprolol tartrate  12.5 mg Per Tube BID   [START ON 11/05/2020] pantoprazole  40 mg Oral Daily   [START ON 11/04/2020] sodium chloride flush  3 mL Intravenous Q12H   Continuous Infusions:  sodium chloride 20 mL/hr at 11/03/20 1635   [START ON 11/04/2020] sodium chloride     sodium chloride     albumin human     And  sodium chloride     dexmedetomidine (PRECEDEX) IV infusion     famotidine (PEPCID) IV 20 mg (11/03/20 1636)   insulin     lactated ringers     lactated ringers     lactated ringers     [START ON 11/04/2020] levofloxacin (LEVAQUIN) IV     magnesium sulfate     nitroGLYCERIN     phenylephrine (NEO-SYNEPHRINE) Adult infusion     potassium chloride     vancomycin     PRN Meds:.sodium chloride, albumin human **AND** sodium chloride, dextrose, lactated ringers, metoprolol tartrate, midazolam, morphine injection, ondansetron (ZOFRAN) IV, oxyCODONE, [START ON 11/04/2020] sodium chloride flush, traMADol  Allergies:  Allergies  Allergen Reactions   Amoxicillin Other (See Comments)    Bad taste in mouth & tongue gets fuzzy feeling   Statins     Oral  problems and bad taste Fatigue    Family History  Problem Relation Age of Onset   Parkinson's disease Mother    Cancer Father    Cancer - Prostate Father     Social History:  reports that he quit smoking about 59 years ago. His smoking use included cigarettes. He has never used smokeless tobacco. He reports that he does not currently use alcohol. He reports that he does not use drugs.  ROS: A complete review of systems was performed.  All systems are negative except for pertinent findings as noted.  Physical Exam:  Vital signs in last 24 hours: Temp:  [98.1 F (36.7 C)] 98.1 F (36.7 C) (10/24 0556) Pulse Rate:  [48-62] 49 (10/24 0732) Resp:  [12-17] 15 (10/24 0732) BP: (173)/(53) 173/53 (10/24 0556) SpO2:  [99 %-100 %] 100 % (10/24 1605) FiO2 (%):  [50 %] 50 % (10/24 1605) Weight:  [70.8 kg] 70.8 kg (10/24 0556) Constitutional: Under general anesthesia GU: Circumcised penis with no blood at the urethral meatus  Laboratory Data:  Recent Labs    11/03/20 1248 11/03/20 1307 11/03/20 1331 11/03/20 1418 11/03/20 1422  HGB 9.8* 8.8* 8.2* 7.5* 8.2*  HCT 28.8* 26.0* 24.0* 22.0* 24.0*  PLT 144*  --   --   --   --     Recent Labs    11/03/20 1031 11/03/20 1043 11/03/20 1112 11/03/20 1206 11/03/20 1212 11/03/20 1240 11/03/20 1307 11/03/20 1331 11/03/20 1418 11/03/20 1422  NA 142   < > 139 138   < > 140 139 142 141 143  K 3.5   < > 4.6 4.6   < > 4.7 4.8 4.7 4.0 4.0  CL 102  --  103 103  --   --  105  --  109  --   GLUCOSE 116*  --  107* 135*  --   --  138*  --  128*  --   BUN 13  --  13 14  --   --  14  --  13  --   CREATININE 0.60*  --  0.60* 0.70  --   --  0.60*  --  0.70  --    < > = values in this interval not displayed.     Results for orders placed or performed during the hospital encounter of 11/03/20 (from the past 24 hour(s))  ABO/Rh     Status: None   Collection Time: 11/03/20  7:05 AM  Result Value Ref Range   ABO/RH(D)      A POS Performed at  [facility] [address] [place], [place]  [address]   I-STAT, chem 8     Status: Abnormal   Collection Time: 11/03/20  7:55 AM  Result Value Ref Range   Sodium 141 135 - 145 mmol/L   Potassium 3.7 3.5 - 5.1 mmol/L   Chloride 102 98 - 111 mmol/L   BUN 15 8 - 23 mg/dL   Creatinine, Ser 0.60 (L) 0.61 - 1.24 mg/dL   Glucose, Bld 100 (H) 70 - 99 mg/dL   Calcium, Ion 1.24 1.15 - 1.40 mmol/L   TCO2 29 22 - 32 mmol/L   Hemoglobin 11.6 (L) 13.0 - 17.0 g/dL   HCT 34.0 (L) 39.0 - 52.0 %  I-STAT 7, (LYTES, BLD GAS, ICA, H+H)     Status: Abnormal   Collection Time: 11/03/20  8:00 AM  Result Value Ref Range   pH, Arterial 7.361 7.350 - 7.450   pCO2 arterial 51.2 (H) 32.0 - 48.0 mmHg   pO2, Arterial 465 (H) 83.0 - 108.0 mmHg   Bicarbonate 29.0 (H) 20.0 - 28.0 mmol/L   TCO2 31 22 - 32 mmol/L   O2 Saturation 100.0 %   Acid-Base Excess 3.0 (H) 0.0 - 2.0 mmol/L   Sodium 141 135 - 145 mmol/L   Potassium 3.7 3.5 - 5.1 mmol/L   Calcium, Ion 1.25 1.15 - 1.40 mmol/L   HCT 37.0 (L) 39.0 - 52.0 %   Hemoglobin 12.6 (L) 13.0 - 17.0 g/dL   Sample type ARTERIAL   I-STAT, chem 8     Status: Abnormal   Collection Time: 11/03/20 10:31 AM  Result Value Ref Range   Sodium 142 135 - 145 mmol/L   Potassium 3.5 3.5 - 5.1 mmol/L   Chloride 102 98 - 111 mmol/L   BUN 13 8 - 23 mg/dL   Creatinine, Ser 0.60 (L) 0.61 - 1.24 mg/dL   Glucose, Bld 116 (H) 70 - 99 mg/dL   Calcium, Ion 1.20 1.15 - 1.40 mmol/L   TCO2 30 22 - 32 mmol/L   Hemoglobin 10.9 (L) 13.0 - 17.0 g/dL   HCT 32.0 (L) 39.0 - 52.0 %  I-STAT 7, (LYTES, BLD GAS, ICA, H+H)     Status: Abnormal   Collection Time: 11/03/20 10:43 AM  Result Value Ref Range   pH, Arterial 7.401 7.350 - 7.450   pCO2 arterial 45.7 32.0 - 48.0 mmHg   pO2, Arterial 440 (H) 83.0 - 108.0 mmHg   Bicarbonate 28.3 (H) 20.0 - 28.0 mmol/L   TCO2 30 22 - 32 mmol/L   O2 Saturation 100.0 %   Acid-Base Excess 3.0 (H) 0.0 - 2.0 mmol/L   Sodium 141 135 - 145 mmol/L    Potassium 3.9 3.5 - 5.1 mmol/L   Calcium, Ion 1.08 (L) 1.15 - 1.40 mmol/L   HCT 30.0 (L) 39.0 - 52.0 %   Hemoglobin 10.2 (L) 13.0 - 17.0 g/dL   Sample type ARTERIAL   POCT I-Stat EG7     Status: Abnormal   Collection Time: 11/03/20 11:01 AM  Result Value Ref Range   pH, Ven 7.365 7.250 - 7.430   pCO2, Ven 48.2 44.0 - 60.0 mmHg   pO2, Ven 252.0 (H) 32.0 - 45.0 mmHg   Bicarbonate 27.6 20.0 - 28.0 mmol/L   TCO2 29 22 - 32 mmol/L   O2 Saturation 100.0 %   Acid-Base Excess 2.0 0.0 - 2.0 mmol/L   Sodium 140 135 - 145 mmol/L   Potassium 4.6 3.5 - 5.1 mmol/L   Calcium, Ion 1.09 (L) 1.15 - 1.40  mmol/L   HCT 30.0 (L) 39.0 - 52.0 %   Hemoglobin 10.2 (L) 13.0 - 17.0 g/dL   Sample type VENOUS   I-STAT, chem 8     Status: Abnormal   Collection Time: 11/03/20 11:12 AM  Result Value Ref Range   Sodium 139 135 - 145 mmol/L   Potassium 4.6 3.5 - 5.1 mmol/L   Chloride 103 98 - 111 mmol/L   BUN 13 8 - 23 mg/dL   Creatinine, Ser 0.60 (L) 0.61 - 1.24 mg/dL   Glucose, Bld 107 (H) 70 - 99 mg/dL   Calcium, Ion 1.05 (L) 1.15 - 1.40 mmol/L   TCO2 30 22 - 32 mmol/L   Hemoglobin 10.2 (L) 13.0 - 17.0 g/dL   HCT 30.0 (L) 39.0 - 52.0 %  I-STAT, chem 8     Status: Abnormal   Collection Time: 11/03/20 12:06 PM  Result Value Ref Range   Sodium 138 135 - 145 mmol/L   Potassium 4.6 3.5 - 5.1 mmol/L   Chloride 103 98 - 111 mmol/L   BUN 14 8 - 23 mg/dL   Creatinine, Ser 0.70 0.61 - 1.24 mg/dL   Glucose, Bld 135 (H) 70 - 99 mg/dL   Calcium, Ion 1.07 (L) 1.15 - 1.40 mmol/L   TCO2 29 22 - 32 mmol/L   Hemoglobin 9.5 (L) 13.0 - 17.0 g/dL   HCT 28.0 (L) 39.0 - 52.0 %  I-STAT 7, (LYTES, BLD GAS, ICA, H+H)     Status: Abnormal   Collection Time: 11/03/20 12:12 PM  Result Value Ref Range   pH, Arterial 7.375 7.350 - 7.450   pCO2 arterial 45.9 32.0 - 48.0 mmHg   pO2, Arterial 411 (H) 83.0 - 108.0 mmHg   Bicarbonate 26.9 20.0 - 28.0 mmol/L   TCO2 28 22 - 32 mmol/L   O2 Saturation 100.0 %   Acid-Base Excess 1.0  0.0 - 2.0 mmol/L   Sodium 138 135 - 145 mmol/L   Potassium 4.6 3.5 - 5.1 mmol/L   Calcium, Ion 1.11 (L) 1.15 - 1.40 mmol/L   HCT 30.0 (L) 39.0 - 52.0 %   Hemoglobin 10.2 (L) 13.0 - 17.0 g/dL   Sample type ARTERIAL   I-STAT 7, (LYTES, BLD GAS, ICA, H+H)     Status: Abnormal   Collection Time: 11/03/20 12:40 PM  Result Value Ref Range   pH, Arterial 7.469 (H) 7.350 - 7.450   pCO2 arterial 37.7 32.0 - 48.0 mmHg   pO2, Arterial 357 (H) 83.0 - 108.0 mmHg   Bicarbonate 27.4 20.0 - 28.0 mmol/L   TCO2 29 22 - 32 mmol/L   O2 Saturation 100.0 %   Acid-Base Excess 3.0 (H) 0.0 - 2.0 mmol/L   Sodium 140 135 - 145 mmol/L   Potassium 4.7 3.5 - 5.1 mmol/L   Calcium, Ion 1.07 (L) 1.15 - 1.40 mmol/L   HCT 27.0 (L) 39.0 - 52.0 %   Hemoglobin 9.2 (L) 13.0 - 17.0 g/dL   Sample type ARTERIAL   Hemoglobin and hematocrit, blood     Status: Abnormal   Collection Time: 11/03/20 12:48 PM  Result Value Ref Range   Hemoglobin 9.8 (L) 13.0 - 17.0 g/dL   HCT 28.8 (L) 39.0 - 52.0 %  Platelet count     Status: Abnormal   Collection Time: 11/03/20 12:48 PM  Result Value Ref Range   Platelets 144 (L) 150 - 400 K/uL  Fibrinogen     Status: Abnormal   Collection Time: 11/03/20 12:48  PM  Result Value Ref Range   Fibrinogen 192 (L) 210 - 475 mg/dL  I-STAT, chem 8     Status: Abnormal   Collection Time: 11/03/20  1:07 PM  Result Value Ref Range   Sodium 139 135 - 145 mmol/L   Potassium 4.8 3.5 - 5.1 mmol/L   Chloride 105 98 - 111 mmol/L   BUN 14 8 - 23 mg/dL   Creatinine, Ser 2.32 (L) 0.61 - 1.24 mg/dL   Glucose, Bld 009 (H) 70 - 99 mg/dL   Calcium, Ion 4.17 (L) 1.15 - 1.40 mmol/L   TCO2 28 22 - 32 mmol/L   Hemoglobin 8.8 (L) 13.0 - 17.0 g/dL   HCT 91.9 (L) 95.7 - 90.0 %  I-STAT 7, (LYTES, BLD GAS, ICA, H+H)     Status: Abnormal   Collection Time: 11/03/20  1:31 PM  Result Value Ref Range   pH, Arterial 7.418 7.350 - 7.450   pCO2 arterial 42.0 32.0 - 48.0 mmHg   pO2, Arterial 356 (H) 83.0 - 108.0 mmHg    Bicarbonate 27.1 20.0 - 28.0 mmol/L   TCO2 28 22 - 32 mmol/L   O2 Saturation 100.0 %   Acid-Base Excess 2.0 0.0 - 2.0 mmol/L   Sodium 142 135 - 145 mmol/L   Potassium 4.7 3.5 - 5.1 mmol/L   Calcium, Ion 1.02 (L) 1.15 - 1.40 mmol/L   HCT 24.0 (L) 39.0 - 52.0 %   Hemoglobin 8.2 (L) 13.0 - 17.0 g/dL   Sample type ARTERIAL   I-STAT, chem 8     Status: Abnormal   Collection Time: 11/03/20  2:18 PM  Result Value Ref Range   Sodium 141 135 - 145 mmol/L   Potassium 4.0 3.5 - 5.1 mmol/L   Chloride 109 98 - 111 mmol/L   BUN 13 8 - 23 mg/dL   Creatinine, Ser 9.20 0.61 - 1.24 mg/dL   Glucose, Bld 041 (H) 70 - 99 mg/dL   Calcium, Ion 5.93 (HH) 1.15 - 1.40 mmol/L   TCO2 24 22 - 32 mmol/L   Hemoglobin 7.5 (L) 13.0 - 17.0 g/dL   HCT 01.2 (L) 37.9 - 90.9 %  I-STAT 7, (LYTES, BLD GAS, ICA, H+H)     Status: Abnormal   Collection Time: 11/03/20  2:22 PM  Result Value Ref Range   pH, Arterial 7.408 7.350 - 7.450   pCO2 arterial 39.0 32.0 - 48.0 mmHg   pO2, Arterial 139 (H) 83.0 - 108.0 mmHg   Bicarbonate 24.6 20.0 - 28.0 mmol/L   TCO2 26 22 - 32 mmol/L   O2 Saturation 99.0 %   Acid-Base Excess 0.0 0.0 - 2.0 mmol/L   Sodium 143 135 - 145 mmol/L   Potassium 4.0 3.5 - 5.1 mmol/L   Calcium, Ion 1.57 (HH) 1.15 - 1.40 mmol/L   HCT 24.0 (L) 39.0 - 52.0 %   Hemoglobin 8.2 (L) 13.0 - 17.0 g/dL   Sample type ARTERIAL   Prepare fresh frozen plasma     Status: None (Preliminary result)   Collection Time: 11/03/20  2:51 PM  Result Value Ref Range   Unit Number [id number]    Blood Component Type THAWED PLASMA    Unit division 00    Status of Unit ISSUED    Transfusion Status OK TO TRANSFUSE    Unit Number [id number]    Blood Component Type THAWED PLASMA    Unit division 00    Status of Unit ISSUED    Transfusion Status  OK TO TRANSFUSE Performed at [facility], [place] [address], [place], [place] [address]   Glucose, capillary     Status: Abnormal   Collection Time: 11/03/20   4:30 PM  Result Value Ref Range   Glucose-Capillary 162 (H) 70 - 99 mg/dL   Recent Results (from the past 240 hour(s))  Surgical pcr screen     Status: Abnormal   Collection Time: 10/30/20  2:01 PM   Specimen: Nasal Mucosa; Nasal Swab  Result Value Ref Range Status   MRSA, PCR NEGATIVE NEGATIVE Final   Staphylococcus aureus POSITIVE (A) NEGATIVE Final    Comment: (NOTE) The Xpert SA Assay (FDA approved for NASAL specimens in patients 31 years of age and older), is one component of a comprehensive surveillance program. It is not intended to diagnose infection nor to guide or monitor treatment. Performed at [facility], [place] [address] [place], [place] [address]   SARS CORONAVIRUS 2 (TAT 6-24 HRS) Nasopharyngeal Nasopharyngeal Swab     Status: None   Collection Time: 10/30/20  2:02 PM   Specimen: Nasopharyngeal Swab  Result Value Ref Range Status   SARS Coronavirus 2 NEGATIVE NEGATIVE Final    Comment: (NOTE) SARS-CoV-2 target nucleic acids are NOT DETECTED.  The SARS-CoV-2 RNA is generally detectable in upper and lower respiratory specimens during the acute phase of infection. Negative results do not preclude SARS-CoV-2 infection, do not rule out co-infections with other pathogens, and should not be used as the sole basis for treatment or other patient management decisions. Negative results must be combined with clinical observations, patient history, and epidemiological information. The expected result is Negative.  Fact Sheet for Patients: SugarRoll.be  Fact Sheet for Healthcare Providers: https://www.woods-mathews.com/  This test is not yet approved or cleared by the [place] FDA and  has been authorized for detection and/or diagnosis of SARS-CoV-2 by FDA under an Emergency Use Authorization (EUA). This EUA will remain  in effect (meaning this test can be used) for the duration of the COVID-19 declaration under Se  ction 564(b)(1) of the Act, 21 U.S.C. section 360bbb-3(b)(1), unless the authorization is terminated or revoked sooner.  Performed at [facility], [place] [address], , [place] [address]     Renal Function: Recent Labs    10/30/20 1417 11/03/20 0755 11/03/20 1031 11/03/20 1112 11/03/20 1206 11/03/20 1307 11/03/20 1418  CREATININE 0.79 0.60* 0.60* 0.60* 0.70 0.60* 0.70   Estimated Creatinine Clearance: 75 mL/min (by C-G formula based on SCr of 0.7 mg/dL).  Radiologic Imaging: ECHO INTRAOPERATIVE TEE  Result Date: 11/03/2020  *INTRAOPERATIVE TRANSESOPHAGEAL REPORT *  Patient Name:   Justin Contreras Date of Exam: 11/03/2020 Medical Rec #:  [id number]       Height:       74.0 in Accession #:    [id number]      Weight:       156.0 lb Date of Birth:  May 15, 1940       BSA:          1.96 m Patient Age:    14 years        BP:           146/54 mmHg Patient Gender: M               HR:           76 bpm. Exam Location:  Anesthesiology Transesophogeal exam was perform intraoperatively during surgical procedure. Patient was closely monitored under general anesthesia  during the entirety of examination. Indications:     I71.2 Ascending aortic aneurysm Sonographer:     Raquel Sarna Senior RDCS Performing Phys: Leroy Diagnosing Phys: Duane Boston MD Complications: No known complications during this procedure. POST-OP IMPRESSIONS _ Aorta: A graft was placed in the ascending aorta for repair.Aortic graft seen in good position. Aortic valve with significantly less AI. _ Aortic Valve: No stenosis present. There is mild regurgitation. _ Comments: Aortic valve with mild AI, aortic graft in place. Otherwise exam unchanged. PRE-OP FINDINGS  Left Ventricle: The left ventricle has moderately reduced systolic function, with an ejection fraction of 35-40% 32%. The cavity size was moderately dilated. There is mild left ventricular hypertrophy. Right Ventricle: The right ventricle has normal systolic  function. The cavity was normal. There is no increase in right ventricular wall thickness. Left Atrium: Left atrial size was normal in size. No left atrial/left atrial appendage thrombus was detected. Right Atrium: Right atrial size was normal in size. Interatrial Septum: No atrial level shunt detected by color flow Doppler. Pericardium: There is no evidence of pericardial effusion. Mitral Valve: The mitral valve is normal in structure. Mitral valve regurgitation is trivial by color flow Doppler. There is No evidence of mitral stenosis. Tricuspid Valve: The tricuspid valve was normal in structure. Tricuspid valve regurgitation is trivial by color flow Doppler. Aortic Valve: The aortic valve is tricuspid Aortic valve regurgitation is moderate by color flow Doppler. The jet is posteriorly-directed. There is no stenosis of the aortic valve. Pulmonic Valve: The pulmonic valve was normal in structure. Pulmonic valve regurgitation is trivial by color flow Doppler.  Duane Boston MD Electronically signed by Duane Boston MD Signature Date/Time: 11/03/2020/3:22:59 PM    Final     I independently reviewed the above imaging studies.  Procedures: Due to the patient being under general anesthesia, informed consent could not be obtained.  The patient's genitalia were prepped and draped in usual fashion.  A 16 French flexible cystoscope was then advanced into the urethral meatus and up to the more proximal aspects of the prostatic urethra where he was noted to have a slight degree of stenosis.  I was able to easily navigate the 16 [place] flexible scope into the bladder.  A complete bladder survey revealed severe trabeculation with no other intravesical abnormalities.  I then placed a Glidewire through the flexible cystoscope and into the bladder.  The flexible scope was then slowly retracted and identified no evidence of urethral trauma.  A 14 French temperature probe catheter was then advanced over the wire and into the  bladder with return of clear-yellow urine.  10 mL of sterile water was used to inflate the Foley catheter balloon which was then placed to gravity drainage.  Dr. Roxan Hockey and his team then proceeded with their portion of the case.   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  Recommend keeping his Foley catheter in place for the standard interval following a sending aortic aneurysm repair.  He can follow-up with Dr. Diona Fanti for further evaluation of his recurrent prostate cancer.  Ellison Hughs, MD [facility] 11/03/2020, 4:44 PM

## 2020-11-03 NOTE — Progress Notes (Signed)
RT extubation note: Patient had a NIF of -35, VC of 1.0 and et tube cuff leak. Extubated patient to 3L nasal canula. Patient oriented to and place with goof productive cough. Patient used incentive spirometer times 5 with volumes of 250 to 600.

## 2020-11-03 NOTE — Plan of Care (Signed)
  Problem: Education: Goal: Knowledge of disease or condition will improve Outcome: Progressing Goal: Knowledge of the prescribed therapeutic regimen will improve Outcome: Progressing   Problem: Activity: Goal: Risk for activity intolerance will decrease Outcome: Progressing   Problem: Cardiac: Goal: Will achieve and/or maintain hemodynamic stability Outcome: Progressing   Problem: Clinical Measurements: Goal: Postoperative complications will be avoided or minimized Outcome: Progressing   Problem: Respiratory: Goal: Respiratory status will improve Outcome: Progressing   Problem: Urinary Elimination: Goal: Ability to achieve and maintain adequate renal perfusion and functioning will improve Outcome: Progressing

## 2020-11-03 NOTE — Anesthesia Procedure Notes (Signed)
Central Venous Catheter Insertion Performed by: Duane Boston, MD, anesthesiologist Start/End10/24/2022 6:49 AM, 11/03/2020 6:59 AM Patient location: Pre-op. Preanesthetic checklist: patient identified, IV checked, site marked, risks and benefits discussed, surgical consent, monitors and equipment checked, pre-op evaluation, timeout performed and anesthesia consent Position: Trendelenburg Lidocaine 1% used for infiltration and patient sedated Hand hygiene performed , maximum sterile barriers used  and Seldinger technique used Catheter size: 8.5 Fr Total catheter length 8. PA cath was placed.Sheath introducer Swan type:thermodilution PA Cath depth:50 Procedure performed using ultrasound guided technique. Ultrasound Notes:anatomy identified, needle tip was noted to be adjacent to the nerve/plexus identified, no ultrasound evidence of intravascular and/or intraneural injection and image(s) printed for medical record Attempts: 1 Following insertion, line sutured, dressing applied and Biopatch. Post procedure assessment: free fluid flow, blood return through all ports and no air  Patient tolerated the procedure well with no immediate complications.

## 2020-11-03 NOTE — Transfer of Care (Signed)
Immediate Anesthesia Transfer of Care Note  Patient: ROD MAJERUS  Procedure(s) Performed: THORACIC ASCENDING ANEURYSM REPAIR (AAA) USING HEMASHIELD PLATINUM 32MM GRAFT TRANSESOPHAGEAL ECHOCARDIOGRAM (TEE)  Patient Location: SICU  Anesthesia Type:General  Level of Consciousness: Patient remains intubated per anesthesia plan  Airway & Oxygen Therapy: Patient remains intubated per anesthesia plan and Patient placed on Ventilator (see vital sign flow sheet for setting)  Post-op Assessment: Report given to RN and Post -op Vital signs reviewed and stable  Post vital signs: Reviewed and stable  Last Vitals:  Vitals Value Taken Time  BP    Temp    Pulse 93 11/03/20 1605  Resp 17 11/03/20 1605  SpO2 100 % 11/03/20 1605  Vitals shown include unvalidated device data.  Last Pain:  Vitals:   11/03/20 0612  TempSrc:   PainSc: 0-No pain         Complications: No notable events documented.

## 2020-11-04 ENCOUNTER — Encounter (HOSPITAL_COMMUNITY): Payer: Self-pay | Admitting: Thoracic Surgery (Cardiothoracic Vascular Surgery)

## 2020-11-04 ENCOUNTER — Inpatient Hospital Stay (HOSPITAL_COMMUNITY): Payer: Medicare Other

## 2020-11-04 LAB — BASIC METABOLIC PANEL
Anion gap: 5 (ref 5–15)
Anion gap: 5 (ref 5–15)
BUN: 15 mg/dL (ref 8–23)
BUN: 19 mg/dL (ref 8–23)
CO2: 23 mmol/L (ref 22–32)
CO2: 26 mmol/L (ref 22–32)
Calcium: 7.8 mg/dL — ABNORMAL LOW (ref 8.9–10.3)
Calcium: 8.2 mg/dL — ABNORMAL LOW (ref 8.9–10.3)
Chloride: 109 mmol/L (ref 98–111)
Chloride: 104 mmol/L (ref 98–111)
Creatinine, Ser: 0.91 mg/dL (ref 0.61–1.24)
Creatinine, Ser: 1.13 mg/dL (ref 0.61–1.24)
GFR, Estimated: >60 mL/min (ref >60)
GFR, Estimated: >60 mL/min (ref >60)
Glucose, Bld: 128 mg/dL — ABNORMAL HIGH (ref 70–99)
Glucose, Bld: 123 mg/dL — ABNORMAL HIGH (ref 70–99)
Potassium: 3.9 mmol/L (ref 3.5–5.1)
Potassium: 4.2 mmol/L (ref 3.5–5.1)
Sodium: 137 mmol/L (ref 135–145)
Sodium: 135 mmol/L (ref 135–145)

## 2020-11-04 LAB — CBC
HCT: 25.7 % — ABNORMAL LOW (ref 39.0–52.0)
HCT: 25.2 % — ABNORMAL LOW (ref 39.0–52.0)
Hemoglobin: 8.4 g/dL — ABNORMAL LOW (ref 13.0–17.0)
Hemoglobin: 8.4 g/dL — ABNORMAL LOW (ref 13.0–17.0)
MCH: 30.5 pg (ref 26.0–34.0)
MCH: 31.6 pg (ref 26.0–34.0)
MCHC: 32.7 g/dL (ref 30.0–36.0)
MCHC: 33.3 g/dL (ref 30.0–36.0)
MCV: 93.5 fL (ref 80.0–100.0)
MCV: 94.7 fL (ref 80.0–100.0)
Platelets: 80 10*3/uL — ABNORMAL LOW (ref 150–400)
Platelets: 84 10*3/uL — ABNORMAL LOW (ref 150–400)
RBC: 2.75 MIL/uL — ABNORMAL LOW (ref 4.22–5.81)
RBC: 2.66 MIL/uL — ABNORMAL LOW (ref 4.22–5.81)
RDW: 13.5 % (ref 11.5–15.5)
RDW: 14 % (ref 11.5–15.5)
WBC: 11.4 10*3/uL — ABNORMAL HIGH (ref 4.0–10.5)
WBC: 11.7 10*3/uL — ABNORMAL HIGH (ref 4.0–10.5)
nRBC: 0 % (ref 0.0–0.2)
nRBC: 0 % (ref 0.0–0.2)

## 2020-11-04 LAB — PREPARE FRESH FROZEN PLASMA
Unit division: 0
Unit division: 0

## 2020-11-04 LAB — GLUCOSE, CAPILLARY
Glucose-Capillary: 103 mg/dL — ABNORMAL HIGH (ref 70–99)
Glucose-Capillary: 104 mg/dL — ABNORMAL HIGH (ref 70–99)
Glucose-Capillary: 105 mg/dL — ABNORMAL HIGH (ref 70–99)
Glucose-Capillary: 110 mg/dL — ABNORMAL HIGH (ref 70–99)
Glucose-Capillary: 110 mg/dL — ABNORMAL HIGH (ref 70–99)
Glucose-Capillary: 116 mg/dL — ABNORMAL HIGH (ref 70–99)
Glucose-Capillary: 128 mg/dL — ABNORMAL HIGH (ref 70–99)
Glucose-Capillary: 129 mg/dL — ABNORMAL HIGH (ref 70–99)
Glucose-Capillary: 138 mg/dL — ABNORMAL HIGH (ref 70–99)
Glucose-Capillary: 138 mg/dL — ABNORMAL HIGH (ref 70–99)
Glucose-Capillary: 155 mg/dL — ABNORMAL HIGH (ref 70–99)
Glucose-Capillary: 200 mg/dL — ABNORMAL HIGH (ref 70–99)
Glucose-Capillary: 84 mg/dL (ref 70–99)

## 2020-11-04 LAB — BPAM FFP
Blood Product Expiration Date: 202210292359
Blood Product Expiration Date: 202210292359
ISSUE DATE / TIME: 202210241501
ISSUE DATE / TIME: 202210241501
Unit Type and Rh: 600
Unit Type and Rh: 6200

## 2020-11-04 LAB — POCT I-STAT 7, (LYTES, BLD GAS, ICA,H+H)
Acid-Base Excess: 1 mmol/L (ref 0.0–2.0)
Bicarbonate: 25.8 mmol/L (ref 20.0–28.0)
Calcium, Ion: 1.22 mmol/L (ref 1.15–1.40)
HCT: 23 % — ABNORMAL LOW (ref 39.0–52.0)
Hemoglobin: 7.8 g/dL — ABNORMAL LOW (ref 13.0–17.0)
O2 Saturation: 99 %
Patient temperature: 37.2
Potassium: 4.1 mmol/L (ref 3.5–5.1)
Sodium: 141 mmol/L (ref 135–145)
TCO2: 27 mmol/L (ref 22–32)
pCO2 arterial: 42.6 mmHg (ref 32.0–48.0)
pH, Arterial: 7.391 (ref 7.350–7.450)
pO2, Arterial: 129 mmHg — ABNORMAL HIGH (ref 83.0–108.0)

## 2020-11-04 LAB — SURGICAL PATHOLOGY

## 2020-11-04 LAB — MAGNESIUM
Magnesium: 2.6 mg/dL — ABNORMAL HIGH (ref 1.7–2.4)
Magnesium: 2.2 mg/dL (ref 1.7–2.4)

## 2020-11-04 LAB — COOXEMETRY PANEL
Carboxyhemoglobin: 1.4 % (ref 0.5–1.5)
Methemoglobin: 1 % (ref 0.0–1.5)
O2 Saturation: 62.4 %
Total hemoglobin: 8.4 g/dL — ABNORMAL LOW (ref 12.0–16.0)

## 2020-11-04 MED ORDER — CARVEDILOL 3.125 MG PO TABS
3.1250 mg | ORAL_TABLET | Freq: Two times a day (BID) | ORAL | Status: DC
  Administered 2020-11-04 – 2020-11-09 (×11): 3.125 mg via ORAL
  Filled 2020-11-04 (×11): qty 1

## 2020-11-04 MED ORDER — POLYETHYL GLYCOL-PROPYL GLYCOL 0.4-0.3 % OP GEL
Freq: Every day | OPHTHALMIC | Status: DC | PRN
Start: 2020-11-04 — End: 2020-11-09
  Filled 2020-11-04: qty 10

## 2020-11-04 MED ORDER — TAMSULOSIN HCL 0.4 MG PO CAPS
0.4000 mg | ORAL_CAPSULE | Freq: Every day | ORAL | Status: DC
  Administered 2020-11-04 – 2020-11-09 (×6): 0.4 mg via ORAL
  Filled 2020-11-04 (×6): qty 1

## 2020-11-04 MED ORDER — INSULIN ASPART 100 UNIT/ML IJ SOLN
0.0000 [IU] | INTRAMUSCULAR | Status: DC
  Administered 2020-11-04: 2 [IU] via SUBCUTANEOUS
  Administered 2020-11-04: 4 [IU] via SUBCUTANEOUS
  Administered 2020-11-04: 2 [IU] via SUBCUTANEOUS

## 2020-11-04 MED ORDER — POTASSIUM CHLORIDE 10 MEQ/50ML IV SOLN
10.0000 meq | INTRAVENOUS | Status: AC
  Administered 2020-11-04 (×3): 10 meq via INTRAVENOUS
  Filled 2020-11-04 (×3): qty 50

## 2020-11-04 MED ORDER — FUROSEMIDE 40 MG PO TABS
40.0000 mg | ORAL_TABLET | Freq: Every day | ORAL | Status: DC
  Administered 2020-11-04 – 2020-11-09 (×6): 40 mg via ORAL
  Filled 2020-11-04 (×6): qty 1

## 2020-11-04 MED ORDER — INSULIN DETEMIR 100 UNIT/ML ~~LOC~~ SOLN
10.0000 [IU] | Freq: Two times a day (BID) | SUBCUTANEOUS | Status: DC
  Administered 2020-11-04 – 2020-11-05 (×3): 10 [IU] via SUBCUTANEOUS
  Filled 2020-11-04 (×4): qty 0.1

## 2020-11-04 NOTE — Progress Notes (Signed)
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 PLATINUM 32MM GRAFT (N/A) TRANSESOPHAGEAL ECHOCARDIOGRAM (TEE) (N/A) Subjective: " Feel better than I thought I would"  Objective: Vital signs in last 24 hours: Temp:  [95 F (35 C)-99.9 F (37.7 C)] 99.7 F (37.6 C) (10/25 0700) Pulse Rate:  [73-109] 100 (10/25 0700) Cardiac Rhythm: Sinus tachycardia (10/25 0400) Resp:  [11-26] 16 (10/25 0700) BP: (79-142)/(52-78) 101/61 (10/25 0700) SpO2:  [86 %-100 %] 99 % (10/25 0700) Arterial Line BP: (78-177)/(43-82) 121/54 (10/25 0700) FiO2 (%):  [40 %-50 %] 40 % (10/24 2217) Weight:  [74.9 kg] 74.9 kg (10/25 0500)  Hemodynamic parameters for last 24 hours: PAP: (6-21)/(-4-10) 13/3 CO:  [2.9 L/min-5.7 L/min] 4.8 L/min CI:  [1.5 L/min/m2-2.9 L/min/m2] 2.5 L/min/m2  Intake/Output from previous day: 10/24 0701 - 10/25 0700 In: 7848.8 [I.V.:4092.3; Blood:1746; IV Piggyback:2010.4] Out: 8850 [Urine:3180; YDXAJ:2878; Chest Tube:575] Intake/Output this shift: No intake/output data recorded.  General appearance: alert, cooperative, and no distress Neurologic: intact Heart: tachy, regular Lungs: clear to auscultation bilaterally Abdomen: normal findings: soft, non-tender  Lab Results: Recent Labs    11/03/20 2229 11/03/20 2234 11/04/20 0006 11/04/20 0257  WBC 15.0*  --   --  11.4*  HGB 8.7*   < > 7.8* 8.4*  HCT 26.9*   < > 23.0* 25.7*  PLT 100*  --   --  80*   < > = values in this interval not displayed.   BMET:  Recent Labs    11/03/20 2229 11/03/20 2234 11/04/20 0006 11/04/20 0257  NA 137   < > 141 137  K 4.0   < > 4.1 3.9  CL 108  --   --  109  CO2 23  --   --  23  GLUCOSE 147*  --   --  128*  BUN 15  --   --  15  CREATININE 0.90  --   --  0.91  CALCIUM 7.9*  --   --  7.8*   < > = values in this interval not displayed.    PT/INR:  Recent Labs    11/03/20 1648  LABPROT 19.8*  INR 1.7*   ABG    Component Value  Date/Time   PHART 7.391 11/04/2020 0006   HCO3 25.8 11/04/2020 0006   TCO2 27 11/04/2020 0006   ACIDBASEDEF 1.0 11/03/2020 2234   O2SAT 62.4 11/04/2020 0257   CBG (last 3)  Recent Labs    11/04/20 0256 11/04/20 0404 11/04/20 0455  GLUCAP 129* 105* 110*    Assessment/Plan: S/P Procedure(s) (LRB): THORACIC ASCENDING ANEURYSM REPAIR (AAA) USING HEMASHIELD PLATINUM 32MM GRAFT (N/A) TRANSESOPHAGEAL ECHOCARDIOGRAM (TEE) (N/A) Doing well POD #1 NEURO- intact CV- in SR, resume Coreg  Wean milrinone RESP- IS RENAL- creatinine and lytes Ok  Diurese ENDO- CBG well controlled  Transition to levemir + SSI GI- advance diet as tolerated Anemia secondary to ABL- mild, follow Thrombocytopenia- SCD for DVT prophylaxis, no enoxaparin Dc chest tubes Mobilize    LOS: 1 day    Melrose Nakayama 11/04/2020

## 2020-11-04 NOTE — Addendum Note (Signed)
Addendum  created 11/04/20 0851 by Josephine Igo, CRNA   Order list changed, Pharmacy for encounter modified

## 2020-11-04 NOTE — Progress Notes (Signed)
      [address]       [place] [address]             [phone]      POD # 1  Eating dinner No complaints  BP (!) 88/53 (BP Location: Right Arm)   Pulse 90   Temp 97.8 F (36.6 C)   Resp (!) 26   Ht 6\' 2"  (1.88 m)   Wt 74.9 kg   SpO2 100%   BMI 21.20 kg/m    Intake/Output Summary (Last 24 hours) at 11/04/2020 1817 Last data filed at 11/04/2020 1700 Gross per 24 hour  Intake 2978.64 ml  Output 1840 ml  Net 1138.64 ml   Doing well POD # 1  Alinah Sheard C. Roxan Hockey, MD [facility] [phone]

## 2020-11-05 ENCOUNTER — Inpatient Hospital Stay (HOSPITAL_COMMUNITY): Payer: Medicare Other

## 2020-11-05 ENCOUNTER — Ambulatory Visit: Payer: Medicare Other | Admitting: Internal Medicine

## 2020-11-05 LAB — BASIC METABOLIC PANEL
Anion gap: 3 — ABNORMAL LOW (ref 5–15)
BUN: 19 mg/dL (ref 8–23)
CO2: 26 mmol/L (ref 22–32)
Calcium: 8.3 mg/dL — ABNORMAL LOW (ref 8.9–10.3)
Chloride: 103 mmol/L (ref 98–111)
Creatinine, Ser: 0.84 mg/dL (ref 0.61–1.24)
GFR, Estimated: >60 mL/min (ref >60)
Glucose, Bld: 73 mg/dL (ref 70–99)
Potassium: 3.6 mmol/L (ref 3.5–5.1)
Sodium: 132 mmol/L — ABNORMAL LOW (ref 135–145)

## 2020-11-05 LAB — CBC
HCT: 24.3 % — ABNORMAL LOW (ref 39.0–52.0)
HCT: 22.4 % — ABNORMAL LOW (ref 39.0–52.0)
Hemoglobin: 7.9 g/dL — ABNORMAL LOW (ref 13.0–17.0)
Hemoglobin: 7.4 g/dL — ABNORMAL LOW (ref 13.0–17.0)
MCH: 30.6 pg (ref 26.0–34.0)
MCH: 30.7 pg (ref 26.0–34.0)
MCHC: 32.5 g/dL (ref 30.0–36.0)
MCHC: 33 g/dL (ref 30.0–36.0)
MCV: 94.2 fL (ref 80.0–100.0)
MCV: 92.9 fL (ref 80.0–100.0)
Platelets: 65 10*3/uL — ABNORMAL LOW (ref 150–400)
Platelets: 74 10*3/uL — ABNORMAL LOW (ref 150–400)
RBC: 2.58 MIL/uL — ABNORMAL LOW (ref 4.22–5.81)
RBC: 2.41 MIL/uL — ABNORMAL LOW (ref 4.22–5.81)
RDW: 14 % (ref 11.5–15.5)
RDW: 14 % (ref 11.5–15.5)
WBC: 10.5 10*3/uL (ref 4.0–10.5)
WBC: 12.4 10*3/uL — ABNORMAL HIGH (ref 4.0–10.5)
nRBC: 0 % (ref 0.0–0.2)
nRBC: 0 % (ref 0.0–0.2)

## 2020-11-05 LAB — GLUCOSE, CAPILLARY
Glucose-Capillary: 79 mg/dL (ref 70–99)
Glucose-Capillary: 98 mg/dL (ref 70–99)
Glucose-Capillary: 93 mg/dL (ref 70–99)

## 2020-11-05 MED ORDER — AMIODARONE HCL IN DEXTROSE 360-4.14 MG/200ML-% IV SOLN
30.0000 mg/h | INTRAVENOUS | Status: AC
  Filled 2020-11-05: qty 200

## 2020-11-05 MED ORDER — SODIUM CHLORIDE 0.9% FLUSH
3.0000 mL | INTRAVENOUS | Status: DC | PRN
  Administered 2020-11-07: 3 mL via INTRAVENOUS

## 2020-11-05 MED ORDER — POTASSIUM CHLORIDE CRYS ER 20 MEQ PO TBCR
20.0000 meq | EXTENDED_RELEASE_TABLET | Freq: Every day | ORAL | Status: DC
  Administered 2020-11-06 – 2020-11-09 (×4): 20 meq via ORAL
  Filled 2020-11-05 (×5): qty 1

## 2020-11-05 MED ORDER — MAGNESIUM HYDROXIDE 400 MG/5ML PO SUSP: 30.0000 mL | Freq: Every day | ORAL | Status: DC | PRN

## 2020-11-05 MED ORDER — SODIUM CHLORIDE 0.9 % IV SOLN: 250.0000 mL | INTRAVENOUS | Status: DC | PRN

## 2020-11-05 MED ORDER — SODIUM CHLORIDE 0.9% FLUSH
3.0000 mL | Freq: Two times a day (BID) | INTRAVENOUS | Status: DC
  Administered 2020-11-05 – 2020-11-09 (×8): 3 mL via INTRAVENOUS

## 2020-11-05 MED ORDER — SODIUM CHLORIDE 0.9 % IV BOLUS
500.0000 mL | Freq: Once | INTRAVENOUS | Status: AC
  Administered 2020-11-05: 500 mL via INTRAVENOUS

## 2020-11-05 MED ORDER — AMIODARONE HCL IN DEXTROSE 360-4.14 MG/200ML-% IV SOLN
60.0000 mg/h | INTRAVENOUS | Status: AC
  Administered 2020-11-05 (×2): 60 mg/h via INTRAVENOUS
  Filled 2020-11-05: qty 200

## 2020-11-05 MED ORDER — AMIODARONE LOAD VIA INFUSION
150.0000 mg | Freq: Once | INTRAVENOUS | Status: AC
  Administered 2020-11-05: 150 mg via INTRAVENOUS
  Filled 2020-11-05: qty 83.34

## 2020-11-05 MED ORDER — POTASSIUM CHLORIDE CRYS ER 20 MEQ PO TBCR
40.0000 meq | EXTENDED_RELEASE_TABLET | Freq: Two times a day (BID) | ORAL | Status: AC
  Administered 2020-11-05 (×2): 40 meq via ORAL
  Filled 2020-11-05 (×2): qty 2

## 2020-11-05 MED ORDER — ~~LOC~~ CARDIAC SURGERY, PATIENT & FAMILY EDUCATION: Freq: Once | Status: AC

## 2020-11-05 MED ORDER — ALUM & MAG HYDROXIDE-SIMETH 200-200-20 MG/5ML PO SUSP: 15.0000 mL | Freq: Four times a day (QID) | ORAL | Status: DC | PRN

## 2020-11-05 MED FILL — Heparin Sodium (Porcine) Inj 1000 Unit/ML: Qty: 1000 | Status: AC

## 2020-11-05 MED FILL — Potassium Chloride Inj 2 mEq/ML: INTRAVENOUS | Qty: 40 | Status: AC

## 2020-11-05 MED FILL — Magnesium Sulfate Inj 50%: INTRAMUSCULAR | Qty: 10 | Status: AC

## 2020-11-05 MED FILL — Lidocaine HCl Local Preservative Free (PF) Inj 2%: INTRAMUSCULAR | Qty: 15 | Status: AC

## 2020-11-05 NOTE — Progress Notes (Signed)
Pt arrived to unit from 2 heart.  A/O x 4,  CCMD called ,CHG given, pt oriented to unit,Will continue to monitor.  Pt Heart rate irregular. 12LED EKG states afib RVR. Will update MD  Phoebe Sharps, RN     11/05/20 1700  Vitals  Temp 97.8 F (36.6 C)  Temp Source Oral  BP 93/60  MAP (mmHg) 70  BP Location Left Arm  BP Method Automatic  Patient Position (if appropriate) Sitting  Pulse Rate (!) 128  Pulse Rate Source Monitor  ECG Heart Rate (!) 117  Resp (!) 27  Level of Consciousness  Level of Consciousness Alert  Oxygen Therapy  SpO2 98 %  O2 Device Room Air  O2 Flow Rate (L/min) 0 L/min  ECG Monitoring  Cardiac Rhythm Atrial fibrillation  MEWS Score  MEWS Temp 0  MEWS Systolic 1  MEWS Pulse 2  MEWS RR 2  MEWS LOC 0  MEWS Score 5  MEWS Score Color Red

## 2020-11-05 NOTE — Progress Notes (Signed)
  Amiodarone Drug - Drug Interaction Consult Note  Recommendations: Watch for increased risk of bradycardia, AV block, and myocardial depression on carvedilol.  Watch for increased risk of cardiotoxicity if hypokalemia occurs on Lasix. Amiodarone is metabolized by the cytochrome P450 system and therefore has the potential to cause many drug interactions. Amiodarone has an average plasma half-life of 50 days (range 20 to 100 days).   There is potential for drug interactions to occur several weeks or months after stopping treatment and the onset of drug interactions may be slow after initiating amiodarone.   []  Statins: Increased risk of myopathy. Simvastatin- restrict dose to 20mg  daily. Other statins: counsel patients to report any muscle pain or weakness immediately.  []  Anticoagulants: Amiodarone can increase anticoagulant effect. Consider warfarin dose reduction. Patients should be monitored closely and the dose of anticoagulant altered accordingly, remembering that amiodarone levels take several weeks to stabilize.  []  Antiepileptics: Amiodarone can increase plasma concentration of phenytoin, the dose should be reduced. Note that small changes in phenytoin dose can result in large changes in levels. Monitor patient and counsel on signs of toxicity.  [x]  Beta blockers: increased risk of bradycardia, AV block and myocardial depression. Sotalol - avoid concomitant use.  []   Calcium channel blockers (diltiazem and verapamil): increased risk of bradycardia, AV block and myocardial depression.  []   Cyclosporine: Amiodarone increases levels of cyclosporine. Reduced dose of cyclosporine is recommended.  []  Digoxin dose should be halved when amiodarone is started.  [x]  Diuretics: increased risk of cardiotoxicity if hypokalemia occurs.  []  Oral hypoglycemic agents (glyburide, glipizide, glimepiride): increased risk of hypoglycemia. Patient's glucose levels should be monitored closely when  initiating amiodarone therapy.   []  Drugs that prolong the QT interval:  Torsades de pointes risk may be increased with concurrent use - avoid if possible.  Monitor QTc, also keep magnesium/potassium WNL if concurrent therapy can't be avoided.  Antibiotics: e.g. fluoroquinolones, erythromycin.  Antiarrhythmics: e.g. quinidine, procainamide, disopyramide, sotalol.  Antipsychotics: e.g. phenothiazines, haloperidol.   Lithium, tricyclic antidepressants, and methadone. Thank You,  Marshia Ly  11/05/2020 5:43 PM

## 2020-11-05 NOTE — Progress Notes (Signed)
Report called to RN on 4 E, pt transferred on telemetry and room air, no c/o pain, wife at bedside aware of he transfer and new room number. Pt placed in the chair in the new room with floor RN present. Pt alert and Oriented x4, no distress noted.

## 2020-11-05 NOTE — Significant Event (Signed)
Rapid Response Event Note   Reason for Call :  Hypotension-SBP-70s. While speaking with RRT, Jadene Pierini, [place] arrived to bedside to assist. RRT arrived at bedside at 1955.  CBC/PCXR/500cc NS bolus had been ordered, and Amiodarone gtt turned off.  Initial Focused Assessment:  Pt lying in bed with eyes closed, in no distress. He is alert and oriented, c/o some mild chest pain. Pt says this pain is no different from the pain he's been having post-op. Lungs diminished t/o. Skin cool to touch.  T-97.6, HR-86(Afib), BP-77/57 via monitor (70/40 manually), RR-28, SpO2-100% on 3L [name]   Interventions:  PCXR-Cardiomegaly with vascular congestion and probable mild interstitial edema. B pleural effusions L>R with slightly worsened airspace dz at the R base and unchanged dense consolidation at L lung base. CBC 500cc bolus(infusing on my arrival) Plan of Care:  BP better after bolus. Await CBC results. Continue to monitor pt closely. Call RRT if further assistance needed.   Event Summary:   MD Notified: Jadene Pierini, [place] notified and came to bedside PTA RRT Call [place] End Time:2027  Dillard Essex, RN

## 2020-11-05 NOTE — Progress Notes (Signed)
Pt BP decreased. MD notified and 500 0.9% NACL bolus started per PA. Amino stopped. Will continue to monitor.      11/05/20 1942  Vitals  BP (!) 89/47  MAP (mmHg) (!) 58  BP Location Left Arm  BP Method Automatic  Patient Position (if appropriate) Lying  Pulse Rate 90  Pulse Rate Source Monitor  ECG Heart Rate 88  Resp (!) 27  Level of Consciousness  Level of Consciousness Alert  Oxygen Therapy  SpO2 96 %  O2 Device Room Air  O2 Flow Rate (L/min) 0 L/min  MEWS Score  MEWS Temp 0  MEWS Systolic 1  MEWS Pulse 0  MEWS RR 2  MEWS LOC 0  MEWS Score 3  MEWS Score Color Yellow

## 2020-11-05 NOTE — Progress Notes (Signed)
2 Days Post-Op Procedure(s) (LRB): THORACIC ASCENDING ANEURYSM REPAIR (AAA) USING HEMASHIELD PLATINUM 32MM GRAFT (N/A) TRANSESOPHAGEAL ECHOCARDIOGRAM (TEE) (N/A) Subjective: Denies pain  Objective: Vital signs in last 24 hours: Temp:  [97.8 F (36.6 C)-99.3 F (37.4 C)] 99 F (37.2 C) (10/26 0300) Pulse Rate:  [73-109] 95 (10/26 0500) Cardiac Rhythm: Normal sinus rhythm (10/25 2000) Resp:  [18-26] 20 (10/26 0500) BP: (88-127)/(51-73) 111/58 (10/26 0500) SpO2:  [96 %-100 %] 99 % (10/26 0500) Arterial Line BP: (128-139)/(54-58) 128/58 (10/25 0900) Weight:  [74.6 kg] 74.6 kg (10/26 0500)  Hemodynamic parameters for last 24 hours: CO:  [4.9 L/min-5.2 L/min] 4.9 L/min CI:  [2.5 L/min/m2-2.7 L/min/m2] 2.5 L/min/m2  Intake/Output from previous day: 10/25 0701 - 10/26 0700 In: 1089.7 [P.O.:540; I.V.:283.2; IV Piggyback:266.5] Out: 1200 [Urine:1160; Chest Tube:40] Intake/Output this shift: No intake/output data recorded.  General appearance: alert, cooperative, and no distress Neurologic: intact Heart: regular rate and rhythm Lungs: clear to auscultation bilaterally Abdomen: normal findings: soft, non-tender  Lab Results: Recent Labs    11/04/20 1702 11/05/20 0503  WBC 11.7* 10.5  HGB 8.4* 7.9*  HCT 25.2* 24.3*  PLT 84* 65*   BMET:  Recent Labs    11/04/20 1702 11/05/20 0503  NA 135 132*  K 4.2 3.6  CL 104 103  CO2 26 26  GLUCOSE 123* 73  BUN 19 19  CREATININE 1.13 0.84  CALCIUM 8.2* 8.3*    PT/INR:  Recent Labs    11/03/20 1648  LABPROT 19.8*  INR 1.7*   ABG    Component Value Date/Time   PHART 7.391 11/04/2020 0006   HCO3 25.8 11/04/2020 0006   TCO2 27 11/04/2020 0006   ACIDBASEDEF 1.0 11/03/2020 2234   O2SAT 62.4 11/04/2020 0257   CBG (last 3)  Recent Labs    11/04/20 2342 11/05/20 0348 11/05/20 0654  GLUCAP 84 79 98    Assessment/Plan: S/P Procedure(s) (LRB): THORACIC ASCENDING ANEURYSM REPAIR (AAA) USING HEMASHIELD PLATINUM 32MM  GRAFT (N/A) TRANSESOPHAGEAL ECHOCARDIOGRAM (TEE) (N/A) POD # 2 NEURO- no focal deficit CV- in SR. BP OK RESP- continue IS RENAL- creatinine OK PO Lasix/ K Dc Foley ENDO- CBG normal GI - diet as tolerated Anemia- Hgb 7.4, monitor Thrombocytopenia- PLT down, not on heparin, monitor Transfer to 4E Continue cardiac rehab   LOS: 2 days    Justin Contreras 11/05/2020

## 2020-11-05 NOTE — Discharge Instructions (Addendum)

## 2020-11-05 NOTE — Progress Notes (Addendum)
After transferred from [place] to 4E around 17:00 per RN day shift reported, Pt had atrial fib with RVR associated with symptomatic hypotensive episode with diaphoresis and lethargic, disoriented to time, place and situations, able to followed commands.  BP 70s/40s. One bolus 150 mg of Amiodarone given and continue maintenance. During that episode, Amiodarone was stopped for two hours and 500 ml IV fluid bolus was given. BP was getting better. Amiodarone was restarted at 21:36, ordered by Dr. Roxan Hockey.    EKG converted back to NSR around 22:00. BP 109/62- 125/67 mmHg, HR 85-100. On 2 LPM of O2 NCL, SPO2 94-98%. No respiratory distress noted. Denied pain. Will continue to monitor.  Kennyth Lose, RN

## 2020-11-05 NOTE — Op Note (Signed)
NAME: Justin Contreras, Justin Contreras MEDICAL RECORD NO: [id number] ACCOUNT NO: [id number] DATE OF BIRTH: 1940/07/31 FACILITY: [facility] LOCATION: [facility] PHYSICIAN: Revonda Standard. Roxan Hockey, MD  Operative Report   DATE OF PROCEDURE: 11/03/2020  PREOPERATIVE DIAGNOSIS:  Ascending thoracic aortic aneurysm.  POSTOPERATIVE DIAGNOSIS:  Ascending thoracic aortic aneurysm.   PROCEDURES PERFORMED:   Median sternotomy Repair of ascending aorta and proximal arch aneurysm with 32 mm Hemashield graft with separate anastomoses to innominate and left common carotid arteries under moderate hypothermic circulatory arrest with antegrade cerebral perfusion.   Right axillary artery cannulation.  SURGEON: Revonda Standard. Roxan Hockey, MD  ASSISTANT: Jadene Pierini, PA  ANESTHESIA:  General.  FINDINGS: Transesophageal echocardiography showed preserved left ventricular function, mild central AI, no abnormality of the aortic valve leaflets.  Post-bypass: minimal AI with good movement of all aortic valve leaflets.  INTRAOPERATIVE FINDINGS:  Large ascending aneurysm with rather abrupt transition to normal size between the left common carotid and left subclavian.  Normal-appearing aortic root.  Aortic valve with 3 leaflets, no calcification.  CLINICAL NOTE:  Justin Contreras is a 80 year old gentleman with a history of recurrent prostate cancer, prior left lower lobectomy for bronchiectasis and hyperlipidemia.  He recently had a PET/CT for workup of recurrence of his prostate cancer and was found  to have an ascending aneurysm.  A CT of the chest showed a 5.7 cm ascending aortic aneurysm.  Further workup included cardiac catheterization, which showed no significant stenoses and an echocardiogram, which showed ejection fraction of approximately 45%  with mild aortic regurgitation.  He was advised to undergo aneurysm repair.  The indications, risks, benefits, and alternatives were discussed in detail with the patient.  He understood and accepted the  risks and agreed to proceed.  OPERATIVE NOTE:  Justin Contreras was brought to the preoperative holding area on 11/03/2020.  Anesthesia placed a Swan-Ganz catheter and arterial blood pressure monitoring lines in both radial arteries.  He was taken to the operating room, anesthetized and  intubated.  We were unable to place a Foley catheter and urology was consulted.  The catheter then was placed by Dr. Ellison Hughs.  Dr. Tedra Senegal performed transesophageal echocardiography.  Please refer to his separately dictated note for  full details of the procedure.  The chest, abdomen and legs were prepped and draped in the usual sterile fashion.  Intravenous antibiotics were administered.  A timeout was performed.  An incision was made in the right deltopectoral groove.  The deltopectoral fat pad was identified.  It was rather minimal given the patient's body habitus.  The axillary artery was dissected out and proximal and distal tapes  were placed around the artery.  The patient was given 5000 units of heparin.  After 3 minutes, the axillary artery was clamped proximally and distally and a longitudinal aortotomy was made.  An 8 mm Hemashield graft was cut on a slight bevel and was  anastomosed end-to-side to the axillary artery with a running 5-0 Prolene suture.  The graft was de-aired and connected to the bypass circuit.  A median sternotomy was performed.  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  The patient also had a mild pectus deformity.  The pericardium was opened.  There was a large ascending aneurysm. A dual stage venous cannula was placed via pursestring suture in the  right atrial appendage.  The patient was fully anticoagulated before placing the right atrial cannula and adequate anticoagulation was confirmed with ACT measurement. After cannulating the right atrium, cardiopulmonary bypass was initiated.  Flows were  maintained per protocol.  Carbon dioxide was insufflated into the operative field.  The  patient was cooled to 24-degree Celsius.  A left ventricular vent was placed via a pursestring suture in the right superior pulmonary vein and directed into the left  ventricle.  A retrograde cardioplegia cannula was placed via pursestring suture in the right atrium and directed into the coronary sinus and an antegrade cardioplegia cannula was placed in the ascending aorta.  The aorta was cross clamped.  The left ventricle was emptied via the aortic root and left ventricular vents. Cardiac arrest then was achieved with a combination of cold antegrade and retrograde KBC cardioplegia. Calculated dose was approximately 1100 mL. He was given a liter  antegrade.  There was a diastolic arrest and then an additional 500 mL was given retrograde and there was septal cooling to 10 degrees Celsius.  Additional doses of cardioplegia were administered retrograde per protocol during the cross-clamp portion of the  procedure.  The aorta was transected and the aorta was divided down to the sinotubular junction and the aorta above that was removed.  The sinuses of Valsalva were relatively normal in size and the aorta was only slightly dilated at the sinotubular junction at about  34 mm.  The coronary ostia were inspected.  There was no evidence of disease.  The decision was made to preserve the aortic root.  A 32 mm Hemashield graft with arch side arms was opened.  The graft was anastomosed to the aorta at the sinotubular  junction with a running 3-0 Prolene suture.  A Teflon felt strip was used to buttress the aortic side of the closure. Saline then was instilled in the graft and inspection showed good coaptation of the aortic valve leaflets.  At this point, the patient had cooled to 24 degrees Celsius.  He was given etomidate and steroids and placed in steep Trendelenburg position. Circulatory arrest then was initiated.  The patient was exsanguinated.  The aortic crossclamp was removed.  The  arch was inspected. As  suspected from the preoperative CT scan, there was an abrupt transition back to a normal diameter of the aorta between the left subclavian and left common carotid.  Decision was made to place the distal end of the graft at the left  subclavian and then use side arms of the graft to bypass to the innominate artery and left common carotid artery. A clamp was placed on the innominate artery and antegrade cerebral perfusion was begun per protocol.  The distal anastomosis was then was  performed to the arch between the left carotid and subclavian with a running 3-0 Prolene suture. Again Teflon felt was used to buttress the aortic side of the closure.  Next, the 8 mm side arm of the graft was cut to an appropriate length and anastomosed  end-to-end to the left common carotid with a running 5-0 Prolene suture and then the 10 mm side arm was cut to an appropriate length and anastomosed end-to-end to the innominate artery with a running 5-0 Prolene suture.  The clamp was removed from the  innominate artery and the graft was allowed to fill with blood.  It then was crossclamped and full bypass was resumed and rewarming was begun.  The proximal and distal ends of the graft were cut to length and anastomosed end-to-end with a running 4-0  Prolene suture.  A McGoon needle was placed into the ascending graft for deairing.  The patient was already in Trendelenburg position.  A  warm dose of reanimation cardioplegia was administered via the retrograde cannula.  De-airing was performed and the aortic  crossclamp was removed.  The total crossclamp time was 89 minutes.  Systemic rewarming was begun as soon as cardiopulmonary bypass was reinitiated.  Rewarming took a considerable period of time and while waiting for rewarming to be accomplished, all the  anastomoses were inspected.  Additional sutures were placed as needed, epicardial pacing wires were placed on the right ventricle and right atrium.  When the patient had  finally rewarmed to a core temperature of 36.5 degrees Celsius, he was weaned from cardiopulmonary bypass.  He was on a low dose milrinone infusion at 0.25 mcg per kilogram per minute at the time of separation from the bypass.   Additional de-airing had been performed and the left ventricular vent and retrograde cannula were removed prior to weaning from bypass. After weaning from bypass, he had a relatively low blood pressure and required addition of epinephrine and  norepinephrine.  Left ventricular function was essentially unchanged from the prebypass study.  Initial cardiac indices were low, but improved with volume resuscitation.  Post-bypass transesophageal echocardiography showed no significant aortic  insufficiency.  Of note, the circulatory arrest time was 39 minutes, with an antegrade cerebral perfusion time of 35 minutes.  The total bypass time was 191 minutes.  The graft to the axillary artery was clamped and divided with a Covidien stapler.  There was some bleeding and additional 5-0 Prolene suture was placed across the staple line.  The side arm for the perfusion for the aortic graft was divided with the  Covidien stapler and there was good hemostasis at that site. The chest was copiously irrigated with warm saline.  Hemostasis was achieved.  There was some ongoing bleeding and the TE showed the need for fresh frozen plasma. That was given once the  patient arrived in the surgical intensive care unit.  A right pleural tube was placed as the right pleural space had been opened during the sternotomy and 2 mediastinal drains were placed, one in the anterior mediastinum and then a Blake drain was placed  into the pericardium along the diaphragmatic surface.  The tubes were secured with #1 silk sutures.  The sternum was closed with heavy-gauge stainless steel wires.  The pectoralis fascia, subcutaneous tissue and skin were closed in standard fashion.   The right deltopectoral groove incision was  irrigated.  Hemostasis was achieved and that wound was closed in standard fashion as well.  All sponge, needle and instrument counts were correct at the end of the procedure. The patient was taken from the  operating room to the surgical intensive care unit, intubated and in critical, but stable condition.  Jadene Pierini, [place] served as Licensed conveyancer for this case, providing retraction and exposure, suture management, suturing and other services as needed.    PAA D: 11/04/2020 5:15:44 pm T: 11/05/2020 1:51:00 am  JOB: [id number]/ [id number]

## 2020-11-06 ENCOUNTER — Inpatient Hospital Stay (HOSPITAL_COMMUNITY): Payer: Medicare Other

## 2020-11-06 DIAGNOSIS — I3139 Other pericardial effusion (noninflammatory): Secondary | ICD-10-CM

## 2020-11-06 LAB — CBC
HCT: 22.8 % — ABNORMAL LOW (ref 39.0–52.0)
Hemoglobin: 7.5 g/dL — ABNORMAL LOW (ref 13.0–17.0)
MCH: 30.6 pg (ref 26.0–34.0)
MCHC: 32.9 g/dL (ref 30.0–36.0)
MCV: 93.1 fL (ref 80.0–100.0)
Platelets: 78 10*3/uL — ABNORMAL LOW (ref 150–400)
RBC: 2.45 MIL/uL — ABNORMAL LOW (ref 4.22–5.81)
RDW: 14.1 % (ref 11.5–15.5)
WBC: 11.3 10*3/uL — ABNORMAL HIGH (ref 4.0–10.5)
nRBC: 0 % (ref 0.0–0.2)

## 2020-11-06 LAB — ECHOCARDIOGRAM COMPLETE
Area-P 1/2: 9.48 cm2
Height: 74 in
MV M vel: 4.99 m/s
MV Peak grad: 99.6 mmHg
Radius: 0.35 cm
S' Lateral: 4.1 cm
Weight: 2673.74 oz

## 2020-11-06 LAB — URINALYSIS, COMPLETE (UACMP) WITH MICROSCOPIC
Bilirubin Urine: NEGATIVE
Glucose, UA: NEGATIVE mg/dL
Ketones, ur: NEGATIVE mg/dL
Leukocytes,Ua: NEGATIVE
Nitrite: NEGATIVE
Protein, ur: NEGATIVE mg/dL
Specific Gravity, Urine: 1.012 (ref 1.005–1.030)
pH: 5 (ref 5.0–8.0)

## 2020-11-06 LAB — PREPARE RBC (CROSSMATCH)

## 2020-11-06 MED ORDER — INFLUENZA VAC A&B SA ADJ QUAD 0.5 ML IM PRSY
0.5000 mL | PREFILLED_SYRINGE | INTRAMUSCULAR | Status: DC
  Filled 2020-11-06: qty 0.5

## 2020-11-06 MED ORDER — AMIODARONE HCL 200 MG PO TABS
400.0000 mg | ORAL_TABLET | Freq: Two times a day (BID) | ORAL | Status: DC
  Administered 2020-11-06 – 2020-11-09 (×7): 400 mg via ORAL
  Filled 2020-11-06 (×7): qty 2

## 2020-11-06 MED ORDER — SODIUM CHLORIDE 0.9% IV SOLUTION: Freq: Once | INTRAVENOUS | Status: AC

## 2020-11-06 MED FILL — Heparin Sodium (Porcine) Inj 1000 Unit/ML: INTRAMUSCULAR | Qty: 10 | Status: AC

## 2020-11-06 MED FILL — Electrolyte-R (PH 7.4) Solution: INTRAVENOUS | Qty: 4000 | Status: AC

## 2020-11-06 MED FILL — Lidocaine HCl (Cardiac) IV PF Soln 100 MG/5ML (2%): INTRAVENOUS | Qty: 5 | Status: AC

## 2020-11-06 MED FILL — Albumin, Human Inj 5%: INTRAVENOUS | Qty: 250 | Status: AC

## 2020-11-06 MED FILL — Sodium Chloride IV Soln 0.9%: INTRAVENOUS | Qty: 3000 | Status: AC

## 2020-11-06 MED FILL — Sodium Bicarbonate IV Soln 8.4%: INTRAVENOUS | Qty: 50 | Status: AC

## 2020-11-06 NOTE — Progress Notes (Signed)
Patient with a history of prostate cancer and significant PSA recurrence. He developed urinary retention and hematuria earlier this am. Nurse contacted me because patient not able to void and is uncomfortable. He still has hematuria. UA to be done. I spoke with Dr. Abner Greenspan who is on call for urology and he asked to have Coude catheter placed as he will not be able to evaluate patient until this afternoon. I called [phone] and was asked to have patient's nurse order Coude catheter kit. Once kit arrives, nurse will contact the aforementioned number to have Coude placed.

## 2020-11-06 NOTE — Progress Notes (Signed)
Pt is more alert and oriented x 3. Hemodynamics stable, NSR on monitor. Denied pain.   Urine output 250 ml in the past 15 hours with presenting of hematuria. Pt stated he has difficulty to urinate. Bladder scan found 403 ml of urine retention at 0630 am.. Dr. Caffie Pinto was notified. MD recommended to wait for the morning round. We will not do in and out cath at this moment due to Pt has a history of prostate cancer and presenting of hematuria. Pt is able to tolerated with no sings and symptoms of acute distress. We will monitor.  Kennyth Lose, RN

## 2020-11-06 NOTE — Progress Notes (Signed)
3 Days Post-Op Subjective: Pt found to be in urinary retention. RN able to straight catheterize overnight and unable to place coude. I placed 16 Fr coude without difficulty. Urine is clear yellow.  Objective: Vital signs in last 24 hours: Temp:  [97.6 F (36.4 C)-99.6 F (37.6 C)] 98.2 F (36.8 C) (10/27 0800) Pulse Rate:  [66-112] 93 (10/27 0800) Resp:  [14-31] 19 (10/27 0800) BP: (55-143)/(32-105) 137/75 (10/27 0800) SpO2:  [93 %-100 %] 96 % (10/27 0800) Weight:  [75.8 kg] 75.8 kg (10/27 0342)  Intake/Output from previous day: 10/26 0701 - 10/27 0700 In: 1313.2 [P.O.:570; I.V.:293; IV Piggyback:450.3] Out: 750 [Urine:750] Intake/Output this shift: Total I/O In: -  Out: 200 [Urine:200]  Physical Exam:  General: Alert and oriented CV: RRR Lungs: Clear Abdomen: Soft, ND, NT Ext: NT, No erythema  Lab Results: Recent Labs    11/05/20 0503 11/05/20 2016 11/06/20 0059  HGB 7.9* 7.4* 7.5*  HCT 24.3* 22.4* 22.8*   BMET Recent Labs    11/04/20 1702 11/05/20 0503  NA 135 132*  K 4.2 3.6  CL 104 103  CO2 26 26  GLUCOSE 123* 73  BUN 19 19  CREATININE 1.13 0.84  CALCIUM 8.2* 8.3*     Studies/Results: DG Chest 2 View  Result Date: 11/06/2020 CLINICAL DATA:  Ascending aortic aneurysm. EXAM: CHEST - 2 VIEW COMPARISON:  11/05/2020 FINDINGS: The cardio pericardial silhouette is enlarged. Retrocardiac left base collapse/consolidation with small left pleural effusion. There is a tiny right pleural effusion. Interstitial markings are diffusely coarsened. Bones are diffusely demineralized. Telemetry leads overlie the chest. IMPRESSION: Retrocardiac left base collapse/consolidation with small left pleural effusion. Electronically Signed   By: Misty Stanley M.D.   On: 11/06/2020 10:30   DG CHEST PORT 1 VIEW  Result Date: 11/05/2020 CLINICAL DATA:  Alteration in blood pressure EXAM: PORTABLE CHEST 1 VIEW COMPARISON:  11/05/2020, 11/04/2020, 10/30/2020 FINDINGS: Post  sternotomy changes. The lung bases are incompletely included. Cardiomegaly with vascular congestion and mild edema. Left greater than right pleural effusions. Increased right basilar airspace disease. No change in dense airspace disease at left base. IMPRESSION: 1. Removal of right IJ catheter sheath. 2. Cardiomegaly with vascular congestion and probable mild interstitial edema. There are bilateral pleural effusions left greater than right with slightly worsened airspace disease at the right base and unchanged dense consolidation at left lung base. Electronically Signed   By: Donavan Foil M.D.   On: 11/05/2020 20:14   DG Chest Port 1 View  Result Date: 11/05/2020 CLINICAL DATA:  Status post abdominal aortic aneurysm repair EXAM: PORTABLE CHEST 1 VIEW COMPARISON:  11/04/2020 FINDINGS: Interval removal of right neck pulmonary vascular catheter with vascular sheath remaining in position. Interval removal of mediastinal and chest tubes. No significant pneumothorax. Cardiomegaly status post median sternotomy. IMPRESSION: 1. Interval removal of right neck pulmonary vascular catheter with vascular sheath remaining in position. 2. Interval removal of mediastinal and chest tubes. No significant pneumothorax. 3.  Cardiomegaly. Electronically Signed   By: Delanna Ahmadi M.D.   On: 11/05/2020 09:05   ECHOCARDIOGRAM COMPLETE  Result Date: 11/06/2020    ECHOCARDIOGRAM REPORT   Patient Name:   Justin Contreras Date of Exam: 11/06/2020 Medical Rec #:  [id number]       Height:       74.0 in Accession #:    [id number]      Weight:       167.1 lb Date of Birth:  03-04-1940  BSA:          2.014 m Patient Age:    80 years        BP:           141/87 mmHg Patient Gender: M               HR:           90 bpm. Exam Location:  Inpatient Procedure: 2D Echo, 3D Echo, Cardiac Doppler, Color Doppler and Strain Analysis STAT ECHO Indications:    Pericardial effusion I31.3  History:        Patient has prior history of Echocardiogram  examinations, most                 recent 11/03/2020. Risk Factors:Hypertension and Dyslipidemia.                 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  Sonographer:    Darlina Sicilian RDCS Referring Phys: Cora  1. Left ventricular ejection fraction, by estimation, is 35 to 40%. The left ventricle has moderately decreased function. The left ventricle demonstrates global hypokinesis. There is mild asymmetric left ventricular hypertrophy of the septal segment. Left ventricular diastolic function could not be evaluated. The average left ventricular global longitudinal strain is -8.3 %. The global longitudinal strain is abnormal.  2. Right ventricular systolic function is moderately reduced. The right ventricular size is normal. Tricuspid regurgitation signal is inadequate for assessing PA pressure.  3. The mitral valve is normal in structure. Mild to moderate mitral valve regurgitation. No evidence of mitral stenosis.  4. The aortic valve is tricuspid. Aortic valve regurgitation is mild. Mild to moderate aortic valve sclerosis/calcification is present, without any evidence of aortic stenosis.  5. Aortic dilatation noted. There is mild dilatation of the aortic root, measuring 39 mm.  6. The inferior vena cava is dilated in size with <50% respiratory variability, suggesting right atrial pressure of 15 mmHg.  7. There is trivial pericardial effusion posterior to the left ventricle. FINDINGS  Left Ventricle: Left ventricular ejection fraction, by estimation, is 35 to 40%. The left ventricle has moderately decreased function. The left ventricle demonstrates global hypokinesis. The average left ventricular global longitudinal strain is -8.3 %.  The global longitudinal strain is abnormal. The left ventricular internal cavity size was normal in size. There is mild asymmetric left ventricular hypertrophy of the septal segment. Abnormal  (paradoxical) septal motion, consistent with left bundle branch block. Left ventricular diastolic function could not be evaluated due to abnormal septal motion. Left ventricular diastolic function could not be evaluated. Right Ventricle: The right ventricular size is normal. No increase in right ventricular wall thickness. Right ventricular systolic function is moderately reduced. Tricuspid regurgitation signal is inadequate for assessing PA pressure. Left Atrium: Left atrial size was normal in size. Right Atrium: Right atrial size was normal in size. Pericardium: Trivial pericardial effusion is present. The pericardial effusion is posterior to the left ventricle. Mitral Valve: The mitral valve is normal in structure. Mild to moderate mitral valve regurgitation, with eccentric posteriorly directed jet. No evidence of mitral valve stenosis. Tricuspid Valve: The tricuspid valve is normal in structure. Tricuspid valve regurgitation is mild . No evidence of tricuspid stenosis. Aortic Valve: The aortic valve is tricuspid. Aortic valve regurgitation is mild. Mild to moderate aortic valve sclerosis/calcification is  present, without any evidence of aortic stenosis. Pulmonic Valve: The pulmonic valve was normal in structure. Pulmonic valve regurgitation is trivial. No evidence of pulmonic stenosis. Aorta: Aortic dilatation noted. There is mild dilatation of the aortic root, measuring 39 mm. Venous: The inferior vena cava is dilated in size with less than 50% respiratory variability, suggesting right atrial pressure of 15 mmHg. IAS/Shunts: No atrial level shunt detected by color flow Doppler.  LEFT VENTRICLE PLAX 2D LVIDd:         5.10 cm   Diastology LVIDs:         4.10 cm   LV e' medial:    10.10 cm/s LV PW:         1.10 cm   LV E/e' medial:  8.1 LV IVS:        1.30 cm   LV e' lateral:   14.15 cm/s LVOT diam:     2.30 cm   LV E/e' lateral: 5.8 LV SV:         44 LV SV Index:   22        2D Longitudinal Strain LVOT Area:      4.15 cm  2D Strain GLS (A2C):   -6.9 %                          2D Strain GLS (A3C):   -9.1 %                          2D Strain GLS (A4C):   -9.0 %                          2D Strain GLS Avg:     -8.3 % RIGHT VENTRICLE RV S prime:     5.01 cm/s TAPSE (M-mode): 1.2 cm LEFT ATRIUM           Index        RIGHT ATRIUM           Index LA diam:      3.20 cm 1.59 cm/m   RA Area:     18.20 cm LA Vol (A2C): 58.8 ml 29.20 ml/m  RA Volume:   44.20 ml  21.95 ml/m LA Vol (A4C): 48.1 ml 23.89 ml/m  AORTIC VALVE             PULMONIC VALVE LVOT Vmax:   68.90 cm/s  RVOT Peak grad: 2 mmHg LVOT Vmean:  53.100 cm/s LVOT VTI:    0.105 m  AORTA Ao Root diam: 3.90 cm Ao Asc diam:  3.30 cm MITRAL VALVE MV Area (PHT): 9.48 cm       SHUNTS MV Decel Time: 80 msec        Systemic VTI:  0.10 m MR Peak grad:    99.6 mmHg    Systemic Diam: 2.30 cm MR Mean grad:    62.0 mmHg    Pulmonic VTI:  0.097 m MR Vmax:         499.00 cm/s MR Vmean:        364.0 cm/s MR PISA:         0.77 cm MR PISA Eff ROA: 6 mm MR PISA Radius:  0.35 cm MV E velocity: 81.95 cm/s Fransico Him MD Electronically signed by Fransico Him MD Signature Date/Time: 11/06/2020/8:46:51 AM    Final     Assessment/Plan: Urinary retention Localized recurrent prostate  cancer  -Dr. Lovena Neighbours placed catheter intra-op 10/24 via cystoscope with slight narrowing of prostatic urethra. Pt failed void trial and is in retention. 16 Fr foley placed with return of clear urine. -I recommend leaving foley catheter in place until he follows up in the office for repeat void trial. -He will f/u with Dr. Diona Fanti as scheduled to discuss recurrent prostate cancer. -Messaged schedulers to arrange. -Please call with questions   LOS: 3 days   Matt R. Cassidy Tashiro MD 11/06/2020, 12:37 PM [facility]  Pager: [phone]

## 2020-11-06 NOTE — Progress Notes (Signed)
Patient complaining of pain and inability to urinate. Bladder scan showed 487 mls of urine in bladder. RN notified PA who stated will notify urology for potential foley catheter placement. Edwena Blow, RN

## 2020-11-06 NOTE — Progress Notes (Signed)
CARDIAC REHAB PHASE I   PRE:  Rate/Rhythm: 86 first deg    BP: sitting 113/56    SaO2: 97 2L  MODE:  Ambulation: 140 ft   POST:  Rate/Rhythm: 97 first deg    BP: sitting 111/84     SaO2: 99 2L  Pt able to move to EOB and stand and verbal cues. Used RW and 2L. Pt unsteady at times, leaning to right side. Able to correct. No major c/o, just weak and tired. To recliner, VSS. Currently getting blood, family present. Practiced IS, 500 ml.  [id number]   [place], ACSM 11/06/2020 3:26 PM

## 2020-11-06 NOTE — Progress Notes (Addendum)
      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 He feels tired this am and has complaints of difficulty swallowing uncoated pills.  Objective: Vital signs in last 24 hours: Temp:  [97.6 F (36.4 C)-99.6 F (37.6 C)] 98.3 F (36.8 C) (10/27 0330) Pulse Rate:  [66-112] 98 (10/27 8756) Cardiac Rhythm: Normal sinus rhythm (10/26 2200) Resp:  [14-31] 20 (10/27 0632) BP: (55-143)/(32-105) 139/72 (10/27 0632) SpO2:  [93 %-100 %] 99 % (10/27 4332) Weight:  [75.8 kg] 75.8 kg (10/27 0342)  Pre op weight  70.8 kg Current Weight  11/06/20 75.8 kg      Intake/Output from previous day: 10/26 0701 - 10/27 0700 In: 1313.2 [P.O.:570; I.V.:293; IV Piggyback:450.3] Out: 750 [Urine:750]   Physical Exam:  Cardiovascular: RRR, no murmurs Pulmonary: Slightly diminished left base;otherwise, clear Abdomen: Soft, non tender, bowel sounds present. Extremities: Trace bilateral lower extremity edema. Wounds: Sternal and right axillary wounds are clean and dry.  No erythema or signs of infection.  Lab Results: CBC: Recent Labs    11/05/20 2016 11/06/20 0059  WBC 12.4* 11.3*  HGB 7.4* 7.5*  HCT 22.4* 22.8*  PLT 74* 78*   BMET:  Recent Labs    11/04/20 1702 11/05/20 0503  NA 135 132*  K 4.2 3.6  CL 104 103  CO2 26 26  GLUCOSE 123* 73  BUN 19 19  CREATININE 1.13 0.84  CALCIUM 8.2* 8.3*    PT/INR:  Lab Results  Component Value Date   INR 1.7 (H) 11/03/2020   INR 1.1 10/30/2020   ABG:  INR: Will add last result for INR, ABG once components are confirmed Will add last 4 CBG results once components are confirmed  Assessment/Plan:  1. CV - He went into a fib with RVR and also had decreased BP last night. He was given a bolus  of fluid and put on Amiodarone drip. He is also on Carvedilol 3.125 mg bid. SR with HR in the 90's this am. Await echo results. Will transition Amiodarone to oral. 2.  Pulmonary - On 2 liters of oxygen via . Will wean as able. CXR appears to show cardiomegaly, bilateral pleural effusions L>R. Encourage incentive spirometer 3. Volume Overload - On Lasix 40 mg daily 4.  Expected post op acute blood loss anemia - H and H this am 7.5 and 22.8. Will transfuse 1 U PRBCs. 5. GU- Patient with a history of prostate cancer. Patient with difficulty urinating and hematuria earlier this am. In and out cath'd as bladder scan showed 403 ml. Continue Flomax. Check UA. I spoke with urology who will evaluate. 6. Thrombocytopenia-platelets this am slightly increased to 78,000  Justin M ZimmermanPA-C 11/06/2020,8:12 AM   Patient seen and examined, agree with above Anemia- Hgb stable but will benefit from transfusion- will give 1 unit Foley replaced by Urology Will dc home with Foley in place with outpatient follow up Echo report reviewed- no significant effusion  Justin Lipps C. Roxan Hockey, MD [facility] [phone]

## 2020-11-06 NOTE — Progress Notes (Signed)
  Echocardiogram Echocardiogram Transesophageal has been performed.  Justin Contreras M 11/06/2020, 8:23 AM

## 2020-11-07 LAB — CBC
HCT: 25.2 % — ABNORMAL LOW (ref 39.0–52.0)
Hemoglobin: 8.7 g/dL — ABNORMAL LOW (ref 13.0–17.0)
MCH: 31.5 pg (ref 26.0–34.0)
MCHC: 34.5 g/dL (ref 30.0–36.0)
MCV: 91.3 fL (ref 80.0–100.0)
Platelets: 133 10*3/uL — ABNORMAL LOW (ref 150–400)
RBC: 2.76 MIL/uL — ABNORMAL LOW (ref 4.22–5.81)
RDW: 14.1 % (ref 11.5–15.5)
WBC: 12.8 10*3/uL — ABNORMAL HIGH (ref 4.0–10.5)
nRBC: 0 % (ref 0.0–0.2)

## 2020-11-07 LAB — TYPE AND SCREEN
ABO/RH(D): A POS
Antibody Screen: NEGATIVE
Unit division: 0

## 2020-11-07 LAB — BPAM RBC
Blood Product Expiration Date: 202211072359
ISSUE DATE / TIME: 202210271323
Unit Type and Rh: 6200

## 2020-11-07 MED ORDER — FE FUMARATE-B12-VIT C-FA-IFC PO CAPS
1.0000 | ORAL_CAPSULE | Freq: Two times a day (BID) | ORAL | Status: DC
  Administered 2020-11-07 – 2020-11-09 (×5): 1 via ORAL
  Filled 2020-11-07 (×5): qty 1

## 2020-11-07 MED ORDER — OXYCODONE HCL 5 MG PO TABS
5.0000 mg | ORAL_TABLET | ORAL | Status: DC | PRN
Start: 2020-11-07 — End: 2020-11-09

## 2020-11-07 MED ORDER — TRAMADOL HCL 50 MG PO TABS: 50.0000 mg | ORAL_TABLET | ORAL | Status: DC | PRN

## 2020-11-07 NOTE — Progress Notes (Addendum)
Resting in bed, awake and alert.  Says he is comfortable.  No new concerns.  Reports poor appetite.     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 Says he rested OK and pain has minimal soreness.   Back in SR.  On RA with adequate O2 saturations.   Objective: Vital signs in last 24 hours: Temp:  [97.4 F (36.3 C)-98.4 F (36.9 C)] 98.4 F (36.9 C) (10/28 0430) Pulse Rate:  [27-96] 96 (10/28 0836) Cardiac Rhythm: Normal sinus rhythm (10/27 1900) Resp:  [15-26] 17 (10/28 0435) BP: (88-125)/(50-67) 121/61 (10/28 0430) SpO2:  [92 %-100 %] 100 % (10/28 0435) Weight:  [74.6 kg] 74.6 kg (10/28 0500)  Pre op weight  70.8 kg Current Weight  11/07/20 74.6 kg      Intake/Output from previous day: 10/27 0701 - 10/28 0700 In: 459.3 [P.O.:120; Blood:339.3] Out: 2700 [Urine:2700]   Physical Exam:  Cardiovascular: RRR Pulmonary: breath sounds are clear Abdomen: Soft, non tender, bowel sounds present. Extremities: no lower extremity edema.  All extremities are warm and well-perfused Wounds: Sternal and right axillary wounds are clean and dry.  No erythema or signs of infection.  Lab Results: CBC: Recent Labs    11/06/20 0059 11/07/20 0119  WBC 11.3* 12.8*  HGB 7.5* 8.7*  HCT 22.8* 25.2*  PLT 78* 133*    BMET:  Recent Labs    11/04/20 1702 11/05/20 0503  NA 135 132*  K 4.2 3.6  CL 104 103  CO2 26 26  GLUCOSE 123* 73  BUN 19 19  CREATININE 1.13 0.84  CALCIUM 8.2* 8.3*     PT/INR:  Lab Results  Component Value Date   INR 1.7 (H) 11/03/2020   INR 1.1 10/30/2020   ABG:  INR: Will add last result for INR, ABG once components are confirmed Will add last 4 CBG results once components are  confirmed  Assessment/Plan:  CV -  a fib with RVR yesterday treated with amiodarone infusion.  He is back in sinus rhythm.  The amiodarone has been transitioned to 400 mg p.o. twice daily and is also on Carvedilol 3.125 mg bid.  Echocardiogram performed yesterday, shows LVEF 35 to 40%.  No significant pericardial effusion.  No significant mitral or aortic valvular dysfunction.  Pulmonary -now on room air with adequate oxygen saturation.  CXR yesterday showed a small left posterior pleural effusion.  Encourage incentive spirometer and continue daily Lasix  HEME- Expected post op acute blood loss anemia -transfused 1 unit of packed red cells yesterday for hematocrit 22.8.  Hematocrit is 25.2 this morning. Thrombocytopenia early postop-platelet count recovering appropriately.  We will add iron supplement and continue to monitor hematocrit.  GU- Patient with a history of prostate cancer. Patient with difficulty urinating and hematuria yesterday.  This persisted despite I&O cath x1.  Urology consulted and a coud catheter was placed at the bedside.  Recommendations are to leave this in place until urology follow-up post discharge.     Joline Maxcy 11/07/2020,8:44 AM   Patient seen and examined, agree with above Looks much better today Mrs. Dolman concerned with hallucinations this afternoon. He is alert and appropriate currently. Not unusual in elderly  following major surgery while in hospital. Should resolve with time  Remo Lipps C. Roxan Hockey, MD [facility] [phone]

## 2020-11-07 NOTE — Progress Notes (Signed)
Follow up from patient report of not having belongings her presented to the hospital with. Patient is wearing his glasses and stated the bag of clothing had been placed in the closet and they had not been aware of it. All items have been accounted for and issue has been resolved

## 2020-11-07 NOTE — Care Management Important Message (Signed)
Important Message  Patient Details  Name: Justin Contreras MRN: [id number] Date of Birth: 1940-05-11   Medicare Important Message Given:  Yes     Shelda Altes 11/07/2020, 10:40 AM

## 2020-11-07 NOTE — Progress Notes (Signed)
Mobility Specialist: Progress Note   11/07/20 1554  Mobility  Activity Ambulated in hall  Level of Assistance Minimal assist, patient does 75% or more  Assistive Device Front wheel walker  Distance Ambulated (ft) 260 ft  Mobility Ambulated with assistance in hallway  Mobility Response Tolerated well  Mobility performed by Mobility specialist  Bed Position Chair  $Mobility charge 1 Mobility   Pre-Mobility: 93 HR, 109/80 BP, 94% SpO2 Post-Mobility: 101 HR, 114/62 BP, 94% SpO2  Pt required minA to stand from EOB. Pt c/o feeling SOB during ambulation, otherwise had no c/o. Pt to recliner after walk with call bell at his side and family present in the room.   [facility] Tayia Stonesifer Mobility Specialist Mobility Specialist Phone: [phone]

## 2020-11-07 NOTE — Progress Notes (Signed)
CARDIAC REHAB PHASE I   PRE:  Rate/Rhythm: 90 SR    BP: sitting 107/66    SaO2: 95 RA  MODE:  Ambulation: 290 ft   POST:  Rate/Rhythm: 100 ST    BP: sitting 124/70     SaO2: 95 RA  Pt needed verbal reminders for how to move to EOB. Stood independently. Much steadier today with RW, contact guard. Fatigue with distance, he blamed mask. Return to bed for EPW pull.   Discussed with pt and wife IS (750 ml), sternal precautions, and exercise. Voiced understanding. Medicare will not cover CR for his dx. Encouraged IS and walking. [place], ACSM 11/07/2020 11:35 AM

## 2020-11-07 NOTE — Progress Notes (Signed)
Patient platelet count up to 130,000. EPW removed without difficulty. Patient tolerated well and VSS. Monitor for one hour and emphasized to patient not to be out of bed until 12:43 pm. He was instructed to call if any problems in the interim.

## 2020-11-08 LAB — CBC
HCT: 26.2 % — ABNORMAL LOW (ref 39.0–52.0)
Hemoglobin: 9 g/dL — ABNORMAL LOW (ref 13.0–17.0)
MCH: 31.7 pg (ref 26.0–34.0)
MCHC: 34.4 g/dL (ref 30.0–36.0)
MCV: 92.3 fL (ref 80.0–100.0)
Platelets: 154 10*3/uL (ref 150–400)
RBC: 2.84 MIL/uL — ABNORMAL LOW (ref 4.22–5.81)
RDW: 14 % (ref 11.5–15.5)
WBC: 8.7 10*3/uL (ref 4.0–10.5)
nRBC: 0 % (ref 0.0–0.2)

## 2020-11-08 LAB — BASIC METABOLIC PANEL
Anion gap: 7 (ref 5–15)
BUN: 24 mg/dL — ABNORMAL HIGH (ref 8–23)
CO2: 26 mmol/L (ref 22–32)
Calcium: 7.9 mg/dL — ABNORMAL LOW (ref 8.9–10.3)
Chloride: 102 mmol/L (ref 98–111)
Creatinine, Ser: 0.86 mg/dL (ref 0.61–1.24)
GFR, Estimated: >60 mL/min (ref >60)
Glucose, Bld: 114 mg/dL — ABNORMAL HIGH (ref 70–99)
Potassium: 3.6 mmol/L (ref 3.5–5.1)
Sodium: 135 mmol/L (ref 135–145)

## 2020-11-08 NOTE — Progress Notes (Signed)
CARDIAC REHAB PHASE I   PRE:  Rate/Rhythm: 99 SR  BP:  Supine: 137/77 Sitting:   Standing:    SaO2: 96% RA  MODE:  Ambulation: 470 ft   POST:  Rate/Rhythm: 105 ST  BP:  Supine:   Sitting: 106/67  Standing:    SaO2: 97% RA  [id number] Patient tolerated ambulation well with assist x1 with gait belt and pushing rolling walker. Gait slow, steady, two standing rest breaks taken. To bed after walk, call bell within reach.  Sol Passer, MS, ACSM CEP

## 2020-11-08 NOTE — Progress Notes (Addendum)
      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  Has already been up walking around the unit this morning  Objective: Vital signs in last 24 hours: Temp:  [97.8 F (36.6 C)-98.4 F (36.9 C)] 98.4 F (36.9 C) (10/29 0405) Pulse Rate:  [86-108] 95 (10/29 0405) Cardiac Rhythm: Normal sinus rhythm;Heart block (10/28 2013) Resp:  [19-23] 20 (10/29 0405) BP: (109-136)/(60-85) 115/79 (10/29 0405) SpO2:  [90 %-99 %] 95 % (10/29 0405) Weight:  [74.6 kg] 74.6 kg (10/29 0405)  Intake/Output from previous day: 10/28 0701 - 10/29 0700 In: 240 [P.O.:240] Out: 2125 [Urine:2125]  General appearance: alert, cooperative, and no distress Heart: regular rate and rhythm Lungs: clear to auscultation bilaterally Abdomen: soft, non-tender; bowel sounds normal; no masses,  no organomegaly Extremities: extremities normal, atraumatic, no cyanosis or edema Wound: clean and dry  Lab Results: Recent Labs    11/07/20 0119 11/08/20 0041  WBC 12.8* 8.7  HGB 8.7* 9.0*  HCT 25.2* 26.2*  PLT 133* 154   BMET:  Recent Labs    11/08/20 0041  NA 135  K 3.6  CL 102  CO2 26  GLUCOSE 114*  BUN 24*  CREATININE 0.86  CALCIUM 7.9*    PT/INR: No results for input(s): LABPROT, INR in the last 72 hours. 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 will have doctor Caili Escalera review and discuss if colchicine should be initiated Pulm- no acute issues, off oxygen, continue IS Renal- creatinine at 0.86, weight is elevated about 8 lbs above baseline, continue Lasix, potassium Expected post operative blood loss anemia, Hgb improved up to 9.0 Ambulate in hallway TID Dispo- patient stable, maintaining NSR, EKG with ST changes suggestive of Pericarditis, will get 12 lead and discuss need for colchicine, if patient remains stable and ambulates well today, will plan to d/c in AM   LOS: 5 days    Ellwood Handler, PA-C 11/08/2020  Agree with above. Asymptomatic.  We will hold off on colchicine for now. [place]

## 2020-11-08 NOTE — Progress Notes (Signed)
Cardiac Rehab Phase I- Attempted to ambulate with patient. Patient eating breakfast, states he walked around 0600. Encouraged patient to walk TID, and patient is agreeable. Will follow-up later today as able, otherwise patient will walk with nursing staff.  Sol Passer, [place], ACSM Juanito Doom 11/08/2020 [phone]

## 2020-11-09 MED ORDER — FUROSEMIDE 40 MG PO TABS: 40.0000 mg | ORAL_TABLET | Freq: Every day | ORAL | 0 refills | Status: DC

## 2020-11-09 MED ORDER — TRAMADOL HCL 50 MG PO TABS: 50.0000 mg | ORAL_TABLET | ORAL | 0 refills | Status: DC | PRN

## 2020-11-09 MED ORDER — AMIODARONE HCL 200 MG PO TABS: 400.0000 mg | ORAL_TABLET | Freq: Two times a day (BID) | ORAL | 1 refills | Status: DC

## 2020-11-09 MED ORDER — POTASSIUM CHLORIDE CRYS ER 20 MEQ PO TBCR: 20.0000 meq | EXTENDED_RELEASE_TABLET | Freq: Every day | ORAL | 0 refills | Status: DC

## 2020-11-09 NOTE — Progress Notes (Signed)
D/C instructions given to patient. Medications and wound care reviewed. Foley care reviewed and leg bag applied. All questions answered. IV removed per order. Wife and son to escort pt home.  Clyde Canterbury, RN

## 2020-11-09 NOTE — Progress Notes (Signed)
      [address]       [place] [address]             [phone]      6 Days Post-Op Procedure(s) (LRB): THORACIC ASCENDING ANEURYSM REPAIR (AAA) USING HEMASHIELD PLATINUM 32MM GRAFT (N/A) TRANSESOPHAGEAL ECHOCARDIOGRAM (TEE) (N/A)  Subjective:  Patient had a rough night.  He states the room got really cold.  He states there was multiple people in and out of the room.  He is hoping to go home.  Objective: Vital signs in last 24 hours: Temp:  [97.7 F (36.5 C)-98.5 F (36.9 C)] 98.2 F (36.8 C) (10/30 0718) Pulse Rate:  [67-101] 67 (10/30 0718) Cardiac Rhythm: Atrial fibrillation (10/30 0700) Resp:  [17-24] 20 (10/30 0718) BP: (108-126)/(59-67) 108/59 (10/30 0718) SpO2:  [92 %-98 %] 95 % (10/30 0718) Weight:  [72.9 kg] 72.9 kg (10/30 0304)  Intake/Output from previous day: 10/29 0701 - 10/30 0700 In: 120 [P.O.:120] Out: 2100 [Urine:2100]  General appearance: alert, cooperative, and no distress Heart: regular rate and rhythm Lungs: clear to auscultation bilaterally Abdomen: soft, non-tender; bowel sounds normal; no masses,  no organomegaly Extremities: edema trace Wound: clean and dry  Lab Results: Recent Labs    11/07/20 0119 11/08/20 0041  WBC 12.8* 8.7  HGB 8.7* 9.0*  HCT 25.2* 26.2*  PLT 133* 154   BMET:  Recent Labs    11/08/20 0041  NA 135  K 3.6  CL 102  CO2 26  GLUCOSE 114*  BUN 24*  CREATININE 0.86  CALCIUM 7.9*    PT/INR: No results for input(s): LABPROT, INR in the last 72 hours. ABG    Component Value Date/Time   PHART 7.391 11/04/2020 0006   HCO3 25.8 11/04/2020 0006   TCO2 27 11/04/2020 0006   ACIDBASEDEF 1.0 11/03/2020 2234   O2SAT 62.4 11/04/2020 0257   CBG (last 3)  No results for input(s): GLUCAP in the last 72 hours.  Assessment/Plan: S/P Procedure(s) (LRB): THORACIC ASCENDING ANEURYSM REPAIR (AAA) USING HEMASHIELD PLATINUM 32MM GRAFT (N/A) TRANSESOPHAGEAL ECHOCARDIOGRAM (TEE) (N/A)  CV- PAF,  maintaining NSR- EKG with improved ST changes- continue Amiodarone, Coreg.. no colchicine needed Pulm- no acute issues, continue Ambulate TID Dispo- patient stable, will d/c home today.   LOS: 6 days    Ellwood Handler, PA-C 11/09/2020

## 2020-11-09 NOTE — Discharge Summary (Signed)
[address]       [place] [address]             [phone]    Physician Discharge Summary  Patient ID: ARWIN BISCEGLIA MRN: [id number] DOB/AGE: 04/20/1940 80 y.o.  Admit date: 11/03/2020 Discharge date: 11/09/2020  Admission Diagnoses:  Patient Active Problem List   Diagnosis Date Noted   Aortic aneurysm without rupture ([place]) 10/17/2020   Acute HFrEF (heart failure with reduced ejection fraction) ([place]) 10/17/2020   Prostate cancer ([place]) 10/17/2020   Chronic obstructive pulmonary disease with acute exacerbation ([place]) 03/11/2020   Degenerative arthritis of thumb, right 08/22/2019   Anxiety 07/05/2016   History of prostate cancer 07/05/2016   AF (amaurosis fugax) 03/07/2015   Headache 03/07/2015   Hyperlipidemia LDL goal <70 03/07/2015   Hyperopia with presbyopia, 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   Discharge Diagnoses:  Patient Active Problem List   Diagnosis Date Noted   S/P ascending aortic aneurysm repair 11/03/2020   Aortic aneurysm without rupture ([place]) 10/17/2020   Acute HFrEF (heart failure with reduced ejection fraction) ([place]) 10/17/2020   Prostate cancer ([place]) 10/17/2020   Chronic obstructive pulmonary disease with acute exacerbation ([place]) 03/11/2020   Degenerative arthritis of thumb, right 08/22/2019   Anxiety 07/05/2016   History of prostate cancer 07/05/2016   AF (amaurosis fugax) 03/07/2015   Headache 03/07/2015   Hyperlipidemia LDL goal <70 03/07/2015   Hyperopia with presbyopia, 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   Discharged Condition: good  HPI:   Justin Contreras is a 80 year old man with a history of prostate cancer with recurrence, left lower lobectomy for bronchiectasis, and hyperlipidemia.  He is followed for prostate cancer by Dr. Diona Fanti.  He had an elevated PSA.  That led to a PET/CT which showed recurrence of his prostate cancer but also showed an ascending thoracic aortic aneurysm.  He then had a CT of the chest, abdomen and pelvis which showed  the aneurysm was approximately 5.7 cm in diameter.   Is not having any chest pain but does complain of general malaise, weight loss, and some shortness of breath with exertion.   Dr Roxan Hockey saw him on 10/01/2020 and we discussed possible surgical repair.  He needed an echocardiogram and a cardiac catheterization.  The studies were reviewed by Dr. Roxan Hockey and he was found to have no significant coronary disease.  His ejection fraction was 45% with some mild global hypokinesis, mild AI and MR.  A discussion was undertaken in terms of medical management and observation versus surgical repair and ultimately a decision was made to proceed with surgery.  He was admitted this hospitalization electively for the procedure.  Hospital course:  On 11/03/2020 he was admitted electively and taken the OR.  He underwent repair of ascending thoracic aneurysm.  He tolerated the procedure well and was taken to the surgical intensive care unit in stable condition.  He was extubated the evening of surgery without difficulty.  He was weaned off Milrinone as hemodynamics allowed.  He was started on diuretics for volume overloaded state.  His chest tubes and arterial lines were removed without difficulty.  He had thrombocytopenia post op. Platelets went as low as 65,000. He also had expected post op blood loss anemia. He did not require a post op transfusion. He was weaned off the Insulin drip. His pre op HGA1C was 5.8. Accu checks and sliding scale were stopped. His last H and H was 9.0/26.2. He was felt surgically stable for transfer from the ICU  to 4E for further convalescence on 10/26. Patient became hypotensive and rapid response was called. He was given a bolus of fluid with improved BP. CBC was checked and H and H was slightly decreased to 7.4 and 22.4. He went into a fib with RVR and put on IV Amiodarone. He did convert to SR. He also had urinary retention and hematuria. He was in and out cath'd after bladder scan  showed 403 ml. He has a history of prostate cancer and had already been started on Flomax. UA was checked and showed moderate blood.  He required placement of foley catheter which will be left in place at discharge with follow up with Urologist.  Echo was ordered 10/26 and was taken this am. It showed LVEF 35-40%, gLV showed global hypokinesis, trivial pericardial effusion. Platelets increased to 78,000 on 10/27. CXR 10/27 showed small left effusion.  Patient continued to remain stable.  He did have some mild ST changes felt to be related to pericarditis.  He was asymptomatic and colchicine was not initiated.  His surgical incisions were healing without evidence of infection.  He is medically stable for discharge.  Consults: None  Significant Diagnostic Studies: cardiac graphics:  CTA CHEST  1. Uncomplicated fusiform aneurysmal dilatation involving the ascending thoracic aorta measuring 58 mm in diameter. Recommend semi-annual imaging followup by CTA or MRA and referral to cardiothoracic surgery if not already obtained. This recommendation follows 2010 ACCF/AHA/AATS/ACR/ASA/SCA/SCAI/SIR/STS/SVM Guidelines for the Diagnosis and Management of Patients With Thoracic Aortic Disease. Circulation. 2010; 121: E266-e369TAA. Aortic aneurysm NOS (ICD10-I71.9) 2. Coronary calcifications.  Aortic Atherosclerosis (ICD10-I70.0). 3. Pleural calcifications, presumably the sequela of previous asbestos exposure. No evidence of pulmonary fibrosis. 4. Indeterminate punctate right-sided pulmonary nodules, the largest of which within the right middle lobe measures 4 mm in diameter. Comparison with prior outside examinations is advised. Otherwise, a non-contrast chest CT can be considered in 12 months as indicated. This recommendation follows the consensus statement: Guidelines for Management of Incidental Pulmonary Nodules Detected on CT Images: From the Fleischner Society 2017; Radiology 2017;  284:228-243.  Treatments: surgery:   PREOPERATIVE DIAGNOSIS:  Ascending thoracic aortic aneurysm.   POSTOPERATIVE DIAGNOSIS:  Same.    PROCEDURES PERFORMED:  Repair of ascending aorta and proximal arch aneurysm with 32 mm Hemashield graft with separate anastomoses to innominate and left common carotid arteries, moderate hypothermic circulatory arrest with antegrade cerebral perfusion,  right axillary artery cannulation.   SURGEON: Revonda Standard. Roxan Hockey, MD   ASSISTANT: Jadene Pierini, PA   Discharge Exam: Blood pressure (!) 108/59, pulse 67, temperature 98.2 F (36.8 C), temperature source Oral, resp. rate 20, height 6\' 2"  (1.88 m), weight 72.9 kg, SpO2 95 %.  General appearance: alert, cooperative, and no distress Heart: regular rate and rhythm Lungs: clear to auscultation bilaterally Abdomen: soft, non-tender; bowel sounds normal; no masses,  no organomegaly Extremities: edema trace Wound: clean and dry  Discharge Medications:  The patient has been discharged on:   1.Beta Blocker:  Yes [ X  ]                              No   [   ]                              If No, reason:  2.Ace Inhibitor/ARB: Yes [   ]  No  [ X   ]                                     If No, reason: labile BP  3.Statin:   Yes [   ]                  No  [  X ]                  If No, reason: Intolerance  4.Shela CommonsVelta Addison  [ X  ]                  No   [   ]                  If No, reason:  Patient had ACS upon admission:  Plavix/P2Y12 inhibitor: Yes [   ]                                      No  [  X ]   Allergies as of 11/09/2020       Reactions   Amoxicillin Other (See Comments)   Bad taste in mouth & tongue gets fuzzy feeling   Statins    Oral problems and bad taste Fatigue        Medication List     STOP taking these medications    lisinopril 5 MG tablet Commonly known as: ZESTRIL       TAKE these medications    amiodarone 200 MG  tablet Commonly known as: PACERONE Take 2 tablets (400 mg total) by mouth 2 (two) times daily. X 3 days, then decrease to 200 mg BID x 7 days, then decrease to 200 mg daily   aspirin EC 81 MG tablet Take 1 tablet (81 mg total) by mouth daily. Swallow whole.   carvedilol 3.125 MG tablet Commonly known as: COREG Take 1 tablet (3.125 mg total) by mouth 2 (two) times daily.   furosemide 40 MG tablet Commonly known as: LASIX Take 1 tablet (40 mg total) by mouth daily.   potassium chloride SA 20 MEQ tablet Commonly known as: KLOR-CON Take 1 tablet (20 mEq total) by mouth daily.   SYSTANE OP Place 1 drop into both eyes daily as needed (dry eyes).   tamsulosin 0.4 MG Caps capsule Commonly known as: FLOMAX Take 0.4 mg by mouth daily.   traMADol 50 MG tablet Commonly known as: ULTRAM Take 1 tablet (50 mg total) by mouth every 4 (four) hours as needed for moderate pain.        Follow-up Information     [facility] Follow up.   Why: F/u in office for void trial. Office will call with appt. Contact information: [facility] [employer]        Melrose Nakayama, MD Follow up in 4 week(s).   Specialty: Cardiothoracic Surgery Why: Office will contact you with appointment date and time Contact information: [address] [place] [address] [phone]                 Signed:  Ellwood Handler, PA-C 11/09/2020, 8:42 AM

## 2020-11-10 ENCOUNTER — Other Ambulatory Visit: Payer: Self-pay | Admitting: Thoracic Surgery (Cardiothoracic Vascular Surgery)

## 2020-11-10 ENCOUNTER — Telehealth: Payer: Self-pay | Admitting: Internal Medicine

## 2020-11-10 DIAGNOSIS — Z48812 Encounter for surgical aftercare following surgery on the circulatory system: Secondary | ICD-10-CM

## 2020-11-10 DIAGNOSIS — Z8679 Personal history of other diseases of the circulatory system: Secondary | ICD-10-CM

## 2020-11-10 NOTE — Telephone Encounter (Signed)
*  STAT* If patient is at the pharmacy, call can be transferred to refill team.   1. Which medications need to be refilled? (please list name of each medication and dose if known) furosemide 40 mg po q d    2. Which pharmacy/location (including street and city if local pharmacy) is medication to be sent to? [id number] - [place], [place] - [place]. AT [address]   3. Do they need a 30 day or 90 day supply? [place]

## 2020-11-10 NOTE — Telephone Encounter (Signed)
Can you please review for refill.   Looks like when patient was in the hospital he was prescribed lasix 40 MG daily but only 3 tablets were sent in.   Please advise if this is something we will need to refill. Looks like patient seen Dr.End at a different hospital visit but does not follow up with [place] in clinic.

## 2020-11-12 ENCOUNTER — Encounter (HOSPITAL_BASED_OUTPATIENT_CLINIC_OR_DEPARTMENT_OTHER): Payer: Self-pay

## 2020-11-12 ENCOUNTER — Other Ambulatory Visit: Payer: Self-pay

## 2020-11-12 DIAGNOSIS — R339 Retention of urine, unspecified: Secondary | ICD-10-CM | POA: Insufficient documentation

## 2020-11-12 DIAGNOSIS — Z8546 Personal history of malignant neoplasm of prostate: Secondary | ICD-10-CM | POA: Diagnosis not present

## 2020-11-12 DIAGNOSIS — Z79899 Other long term (current) drug therapy: Secondary | ICD-10-CM | POA: Insufficient documentation

## 2020-11-12 DIAGNOSIS — J441 Chronic obstructive pulmonary disease with (acute) exacerbation: Secondary | ICD-10-CM | POA: Diagnosis not present

## 2020-11-12 DIAGNOSIS — Z7982 Long term (current) use of aspirin: Secondary | ICD-10-CM | POA: Insufficient documentation

## 2020-11-12 DIAGNOSIS — Z87891 Personal history of nicotine dependence: Secondary | ICD-10-CM | POA: Insufficient documentation

## 2020-11-12 DIAGNOSIS — I509 Heart failure, unspecified: Secondary | ICD-10-CM | POA: Diagnosis not present

## 2020-11-12 DIAGNOSIS — I10 Essential (primary) hypertension: Secondary | ICD-10-CM | POA: Insufficient documentation

## 2020-11-12 DIAGNOSIS — R300 Dysuria: Secondary | ICD-10-CM | POA: Insufficient documentation

## 2020-11-12 NOTE — ED Triage Notes (Signed)
Pt reports recent surgery-states he had urinary cath removed today at urology-c/o "only dribbles" since removal-NAD-slow steady gait

## 2020-11-12 NOTE — Telephone Encounter (Signed)
Patient son calling to check status of request   Advised of below and gave contact information for Dr. Roxan Hockey

## 2020-11-13 ENCOUNTER — Emergency Department (HOSPITAL_BASED_OUTPATIENT_CLINIC_OR_DEPARTMENT_OTHER)
Admission: EM | Admit: 2020-11-13 | Discharge: 2020-11-13 | Disposition: A | Discharge status: Home / Self Care | Payer: Medicare Other | Attending: Emergency Medicine | Admitting: Emergency Medicine

## 2020-11-13 DIAGNOSIS — R339 Retention of urine, unspecified: Secondary | ICD-10-CM

## 2020-11-13 MED ORDER — LIDOCAINE HCL URETHRAL/MUCOSAL 2 % EX GEL: 1.0000 "application " | Freq: Once | CUTANEOUS | Status: DC

## 2020-11-13 NOTE — ED Provider Notes (Addendum)
[facility] EMERGENCY DEPARTMENT Provider Note   CSN: [id number] Arrival date & time: 11/12/20  2220     History Chief Complaint  Patient presents with   Urinary Retention    Justin Contreras is a 80 y.o. male.  80 yo M with a chief complaints of acute urinary retention.  The patient had his Foley removed at his urologist office this morning.  Since then he has only been able to urinate dribbles and has been unable to control his urine.  Started to feel worsening discomfort and so came here for evaluation.  Denies fevers.  The history is provided by the patient.  Illness Severity:  Moderate Onset quality:  Gradual Duration:  1 day Timing:  Constant Progression:  Unchanged Chronicity:  Recurrent Associated symptoms: no abdominal pain, no chest pain, no congestion, no diarrhea, no fever, no headaches, no myalgias, no rash, no shortness of breath and no vomiting       Past Medical History:  Diagnosis Date   Bronchiectasis ([place])    Dyspnea    Hyperlipidemia    Hypertension    Pneumonia    Prostate cancer 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 rupture ([place]) 10/17/2020   Acute HFrEF (heart failure with reduced ejection fraction) ([place]) 10/17/2020   Prostate cancer ([place]) 10/17/2020   Chronic obstructive pulmonary disease with acute exacerbation ([place]) 03/11/2020   Degenerative arthritis of thumb, right 08/22/2019   Anxiety 07/05/2016   History of prostate cancer 07/05/2016   AF (amaurosis fugax) 03/07/2015   Headache 03/07/2015   Hyperlipidemia LDL goal <70 03/07/2015   Hyperopia with presbyopia, 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    Past Surgical History:  Procedure Laterality Date   KNEE ARTHROSCOPY Left    2014   LOBECTOMY Left    Left lower lobectomy   mohs micrographic surgery nose     partial remove lung     2008   prostate  surgery/transperinal placement of needles     RIGHT/LEFT HEART CATH AND CORONARY ANGIOGRAPHY N/A 10/20/2020   Procedure: RIGHT/LEFT HEART CATH AND CORONARY ANGIOGRAPHY;  Surgeon: Nelva Bush, MD;  Location: [facility];  Service: Cardiovascular;  Laterality: N/A;   TEE WITHOUT CARDIOVERSION N/A 11/03/2020   Procedure: TRANSESOPHAGEAL ECHOCARDIOGRAM (TEE);  Surgeon: Melrose Nakayama, MD;  Location: [facility];  Service: Open Heart Surgery;  Laterality: N/A;   THORACIC AORTIC ANEURYSM REPAIR N/A 11/03/2020   Procedure: THORACIC ASCENDING ANEURYSM REPAIR (AAA) USING HEMASHIELD PLATINUM 32MM GRAFT;  Surgeon: Melrose Nakayama, MD;  Location: [facility];  Service: Open Heart Surgery;  Laterality: N/A;  WITH CIRC ARREST       Family History  Problem Relation Age of Onset   Parkinson's disease Mother    Cancer Father    Cancer - Prostate Father     Social History   Tobacco Use   Smoking status: Former    Years: 10.00    Types: Cigarettes    Quit date: 1963    Years since quitting: 59.8   Smokeless tobacco: Never  Vaping Use   Vaping Use: Never used  Substance Use Topics   Alcohol use: Not Currently   Drug use: Never    Home Medications Prior to Admission medications   Medication Sig Start Date End Date Taking? Authorizing Provider  amiodarone (PACERONE) 200 MG tablet Take 2 tablets (400  mg total) by mouth 2 (two) times daily. X 3 days, then decrease to 200 mg BID x 7 days, then decrease to 200 mg daily 11/09/20   Barrett, Lodema Hong, PA-C  aspirin EC 81 MG tablet Take 1 tablet (81 mg total) by mouth daily. Swallow whole. 10/16/20   End, Harrell Gave, MD  carvedilol (COREG) 3.125 MG tablet Take 1 tablet (3.125 mg total) by mouth 2 (two) times daily. 10/16/20 01/14/21  End, Harrell Gave, MD  furosemide (LASIX) 40 MG tablet Take 1 tablet (40 mg total) by mouth daily. 11/09/20   Barrett, Erin R, PA-C  Polyethyl Glycol-Propyl Glycol (SYSTANE OP) Place 1 drop into both eyes daily as  needed (dry eyes).    [provider]  potassium chloride SA (KLOR-CON) 20 MEQ tablet Take 1 tablet (20 mEq total) by mouth daily. 11/09/20   Barrett, Erin R, PA-C  tamsulosin (FLOMAX) 0.4 MG CAPS capsule Take 0.4 mg by mouth daily. 10/23/20   [provider]  traMADol (ULTRAM) 50 MG tablet Take 1 tablet (50 mg total) by mouth every 4 (four) hours as needed for moderate pain. 11/09/20   Barrett, Erin R, PA-C    Allergies    Amoxicillin and Statins  Review of Systems   Review of Systems  Constitutional:  Negative for chills and fever.  HENT:  Negative for congestion and facial swelling.   Eyes:  Negative for discharge and visual disturbance.  Respiratory:  Negative for shortness of breath.   Cardiovascular:  Negative for chest pain and palpitations.  Gastrointestinal:  Negative for abdominal pain, diarrhea and vomiting.  Genitourinary:  Positive for difficulty urinating and dysuria.  Musculoskeletal:  Negative for arthralgias and myalgias.  Skin:  Negative for color change and rash.  Neurological:  Negative for tremors, syncope and headaches.  Psychiatric/Behavioral:  Negative for confusion and dysphoric mood.    Physical Exam Updated Vital Signs BP 135/69 (BP Location: Right Arm)   Pulse 80   Temp 98 F (36.7 C) (Oral)   Resp 16   Ht 6\' 2"  (1.88 m)   Wt 73 kg   SpO2 99%   BMI 20.67 kg/m   Physical Exam Vitals and nursing note reviewed.  Constitutional:      Appearance: He is well-developed.  HENT:     Head: Normocephalic and atraumatic.  Eyes:     Pupils: Pupils are equal, round, and reactive to light.  Neck:     Vascular: No JVD.  Cardiovascular:     Rate and Rhythm: Normal rate and regular rhythm.     Heart sounds: No murmur heard.   No friction rub. No gallop.  Pulmonary:     Effort: No respiratory distress.     Breath sounds: No wheezing.  Abdominal:     General: There is no distension.     Tenderness: There is no abdominal tenderness.  There is no guarding or rebound.  Musculoskeletal:        General: Normal range of motion.     Cervical back: Normal range of motion and neck supple.     Comments: Sternal scar clean dry and intact.  Skin:    Coloration: Skin is not pale.     Findings: No rash.  Neurological:     Mental Status: He is alert and oriented to person, place, and time.  Psychiatric:        Behavior: Behavior normal.    ED Results / Procedures / Treatments   Labs (all labs ordered are listed,  but only abnormal results are displayed) Labs Reviewed - No data to display  EKG None  Radiology No results found.  Procedures Procedures   Medications Ordered in ED Medications - No data to display  ED Course  I have reviewed the triage vital signs and the nursing notes.  Pertinent labs & imaging results that were available during my care of the patient were reviewed by me and considered in my medical decision making (see chart for details).    MDM Rules/Calculators/A&P                           80 yo M with a chief complaints of acute urinary retention.  Patient with greater than 400 cc of urine in his bladder and unable to void.  Foley placed with relief and improvement of the patient's symptoms.  We will have him follow-up with his urologist.  3:42 AM:  I have discussed the diagnosis/risks/treatment options with the patient and family and believe the pt to be eligible for discharge home to follow-up with Urology. We also discussed returning to the ED immediately if new or worsening sx occur. We discussed the sx which are most concerning (e.g., sudden worsening pain, fever, inability to tolerate by mouth) that necessitate immediate return. Medications administered to the patient during their visit and any new prescriptions provided to the patient are listed below.  Medications given during this visit Medications - No data to display   The patient appears reasonably screen and/or stabilized for  discharge and I doubt any other medical condition or other [facility] requiring further screening, evaluation, or treatment in the ED at this time prior to discharge.   Final Clinical Impression(s) / ED Diagnoses Final diagnoses:  Urinary retention    Rx / DC Orders ED Discharge Orders     None        Deno Etienne, DO 11/13/20 [employer], [place], DO 11/13/20 [phone]

## 2020-11-13 NOTE — Discharge Instructions (Signed)
Return for fever, pain or inability to void

## 2020-12-02 ENCOUNTER — Ambulatory Visit
Admission: RE | Admit: 2020-12-02 | Discharge: 2020-12-02 | Disposition: A | Discharge status: Home / Self Care | Payer: Medicare Other | Source: Ambulatory Visit | Attending: Thoracic Surgery (Cardiothoracic Vascular Surgery) | Admitting: Thoracic Surgery (Cardiothoracic Vascular Surgery)

## 2020-12-02 ENCOUNTER — Ambulatory Visit (INDEPENDENT_AMBULATORY_CARE_PROVIDER_SITE_OTHER): Payer: Self-pay | Admitting: Thoracic Surgery (Cardiothoracic Vascular Surgery)

## 2020-12-02 ENCOUNTER — Other Ambulatory Visit: Payer: Self-pay

## 2020-12-02 ENCOUNTER — Telehealth: Payer: Self-pay | Admitting: *Deleted

## 2020-12-02 VITALS — BP 121/62 | HR 75 | Resp 20 | Ht 74.0 in | Wt 152.0 lb

## 2020-12-02 DIAGNOSIS — Z8679 Personal history of other diseases of the circulatory system: Secondary | ICD-10-CM

## 2020-12-02 DIAGNOSIS — Z9889 Other specified postprocedural states: Secondary | ICD-10-CM

## 2020-12-02 NOTE — Progress Notes (Signed)
[address]       [place] [address]             [phone]       HPI: Mr. Poffenberger returns for scheduled follow-up visit.    Omarrion Carmer is a 80 year old man with a history of prostate cancer, hyperlipidemia, and ascending and arch aneurysm.  He recently was diagnosed with a recurrence of his prostate cancer.  A PET/CT showed an ascending aneurysm also involving the proximal arch.  He underwent repair of the aneurysm with bypasses to the innominate and left common carotids on 11/03/2020.  The graft was sewn distally just proximal to the left subclavian where the aorta returned to normal size.  That was done under moderate hypothermic circulatory arrest.  He had some atrial fibrillation postoperatively but converted with amiodarone.  He was discharged on postoperative day #6.  He was in the emergency room about a week later with urinary retention.  He had a Foley catheter replaced.  He has started hormone therapy for his prostate cancer recurrence.  He is not having any incisional pain.  He does get tired with activity.  Overall he feels well.  He has lost about 10 pounds.   Past Medical History:  Diagnosis Date   Bronchiectasis ([place])    Dyspnea    Hyperlipidemia    Hypertension    Pneumonia    Prostate cancer [facility])    Thoracic aortic aneurysm      Current Outpatient Medications  Medication Sig Dispense Refill   amiodarone (PACERONE) 200 MG tablet Take 2 tablets (400 mg total) by mouth 2 (two) times daily. X 3 days, then decrease to 200 mg BID x 7 days, then decrease to 200 mg daily 90 tablet 1   aspirin EC 81 MG tablet Take 1 tablet (81 mg total) by mouth daily. Swallow whole. 90 tablet 0   carvedilol (COREG) 3.125 MG tablet Take 1 tablet (3.125 mg total) by mouth 2 (two) times daily. 180 tablet 0   Polyethyl Glycol-Propyl Glycol (SYSTANE OP) Place 1 drop into both eyes daily as needed (dry eyes).     potassium chloride SA (KLOR-CON) 20 MEQ tablet Take  1 tablet (20 mEq total) by mouth daily. 3 tablet 0   tamsulosin (FLOMAX) 0.4 MG CAPS capsule Take 0.4 mg by mouth daily.     furosemide (LASIX) 40 MG tablet Take 1 tablet (40 mg total) by mouth daily. (Patient not taking: Reported on 12/02/2020) 3 tablet 0   traMADol (ULTRAM) 50 MG tablet Take 1 tablet (50 mg total) by mouth every 4 (four) hours as needed for moderate pain. (Patient not taking: Reported on 12/02/2020) 30 tablet 0   No current facility-administered medications for this visit.    Physical Exam BP 121/62 (BP Location: Right Arm, Patient Position: Sitting)   Pulse 75   Resp 20   Ht 6\' 2"  (1.88 m)   Wt 152 lb (68.9 kg)   SpO2 97% Comment: RA  BMI 19.45 kg/m  80 year old man in no acute distress Alert and oriented x3 with no focal deficits Lungs clear with equal breath sounds bilaterally Cardiac regular rate and rhythm, no rub or murmur Sternum stable, incision well-healed No peripheral edema  Diagnostic Tests: I reviewed his chest x-ray.  Shows postoperative changes.  No active disease.  Impression: Taren Toops is a 80 year old man with a history of prostate cancer, hypertension, hyperlipidemia, and ascending and arch aneurysm.  Ascending and arch aneurysm-underwent  repair about a month ago.  Overall is doing extremely well.  Postoperative atrial fibrillation-resolved with amiodarone.  Currently on 200 mg daily.  He may drive.  Appropriate precautions were discussed.  He should not lift anything over 10 pounds for another 2 weeks.  Can gradually increase activities after that.  Prostate cancer-plan per Dr. Diona Fanti  Urinary retention-plan per Dr. Diona Fanti  Plan: Follow-up with Dr. Diona Fanti I will see him back in about 2 months to check on his progress  Melrose Nakayama, MD [facility] [phone]

## 2020-12-02 NOTE — Telephone Encounter (Signed)
Patient contacted the office c/o constipation since Sunday. Per patient he has tried milk of magnesia twice on Sunday without any relief. Patient denies abdominal distention or pain. States he notices "red blood" on his toilet paper when wiping. Denies blood in stool or toilet. Advised patient to try Miralax and stool softeners. Advised patient to stay hydrated by drinking water, increase activity, and eat fiber rich foods. Patient verbalizes understanding. Has routine post-op follow up today with Dr. Roxan Hockey.

## 2021-01-11 ENCOUNTER — Other Ambulatory Visit: Payer: Self-pay | Admitting: Internal Medicine

## 2021-01-13 NOTE — Telephone Encounter (Signed)
Please reschedule canceled F/U appointment. Thank you!

## 2021-01-13 NOTE — Telephone Encounter (Signed)
Attempted to schedule no ans no vm  

## 2021-01-14 ENCOUNTER — Telehealth: Payer: Self-pay | Admitting: Internal Medicine

## 2021-01-14 NOTE — Telephone Encounter (Signed)
-----   Message from Solmon Ice, RN sent at 01/14/2021  3:57 PM EST ----- Regarding: FW: follow up appt Please schedule pt with an APP at pt's convenience. Thank you! ----- Message ----- From: Nelva Bush, MD Sent: 01/14/2021   3:52 PM EST To: Solmon Ice, RN Subject: RE: follow up appt                             He can see one of the APP's at his convenience.  Thanks.  Gerald Stabs  ----- Message ----- From: Solmon Ice, RN Sent: 01/14/2021   3:29 PM EST To: Nelva Bush, MD Subject: follow up appt                                 Dr. Saunders Revel,  When would you like this pt to follow up with you again? Looks like after his cath he had AAA repair and follow up did not get scheduled back with [place].   Thanks,  [employer]

## 2021-01-14 NOTE — Telephone Encounter (Signed)
Attempted to schedule.  LMOV to call office.  ° °

## 2021-01-19 NOTE — Telephone Encounter (Signed)
Scheduled

## 2021-01-20 NOTE — Progress Notes (Signed)
Office Visit    Patient Name: Justin Contreras Date of Encounter: 01/21/2021  PCP:  Derrill Center., MD   [facility]  Cardiologist:  Nelva Bush, MD  Advanced Practice Provider:  No care team member to display Electrophysiologist:  None    {  HPI:    Justin Contreras is a 81 y.o. male with a hx of prostate cancer, thoracic aortic aneurysm s/p ascending aortic aneurysm repair, hyperlipidemia (statin intolerant), and recurrent lung infections attributed to bronchiectasis status post remote left lung resection presents today for follow-up status post ascending aortic aneurysm repair on 11/03/2020.  After discharge from the hospital he was in the emergency room about a week later with urinary retention.  He had a Foley catheter placed.  He has started hormone therapy for his prostate cancer recurrence.  He was seen by Dr. Roxan Hockey in the office in November and was doing well from a surgical standpoint.  Today, he feels okay from a cardiac standpoint. His biggest worry is that he has had a decline in his memory since his surgery. He feels agitated and frustrated by this and gets easily distracted. He has an appointment next week with a neurologist and has been working with a speech therapist. He has not had any chest pain or SOB. He has had a 20 lbs weight loss but he states his appetite is good. He feels the weight loss is mostly muscular. He did have some post-op atrial fibrillation but has not had a reoccurrence. He is down to 200mg  daily of Amio. His BP was elevated in the clinic today. He is only on Coreg so we discussed starting lisinopril vs. Losartan for better control. I also asked him to keep track of his BP over the next two weeks so we can titrate his medications if indicated. We also discussed cardiopulmonary rehab. He is euvolemic on exam today.   Reports no shortness of breath nor dyspnea on exertion. Reports no chest pain, pressure, or tightness. No  edema, orthopnea, PND. Reports no palpitations.     Past Medical History    Past Medical History:  Diagnosis Date   Bronchiectasis ([place])    Dyspnea    Hyperlipidemia    Hypertension    Pneumonia    Prostate cancer [facility])    Thoracic aortic aneurysm    Past Surgical History:  Procedure Laterality Date   KNEE ARTHROSCOPY Left    2014   LOBECTOMY Left    Left lower lobectomy   mohs micrographic surgery nose     partial remove lung     2008   prostate surgery/transperinal placement of needles     RIGHT/LEFT HEART CATH AND CORONARY ANGIOGRAPHY N/A 10/20/2020   Procedure: RIGHT/LEFT HEART CATH AND CORONARY ANGIOGRAPHY;  Surgeon: Nelva Bush, MD;  Location: [place] [facility];  Service: Cardiovascular;  Laterality: N/A;   TEE WITHOUT CARDIOVERSION N/A 11/03/2020   Procedure: TRANSESOPHAGEAL ECHOCARDIOGRAM (TEE);  Surgeon: Melrose Nakayama, MD;  Location: [place];  Service: Open Heart Surgery;  Laterality: N/A;   THORACIC AORTIC ANEURYSM REPAIR N/A 11/03/2020   Procedure: THORACIC ASCENDING ANEURYSM REPAIR (AAA) USING HEMASHIELD PLATINUM 32MM GRAFT;  Surgeon: Melrose Nakayama, MD;  Location: [place];  Service: Open Heart Surgery;  Laterality: N/A;  WITH CIRC ARREST    Allergies  Allergies  Allergen Reactions   Amoxicillin Other (See Comments)    Bad taste in mouth & tongue gets fuzzy feeling   Statins  Oral problems and bad taste Fatigue     EKGs/Labs/Other Studies Reviewed:   The following studies were reviewed today:  Echocardiogram 11/06/2020 IMPRESSIONS     1. Left ventricular ejection fraction, by estimation, is 35 to 40%. The  left ventricle has moderately decreased function. The left ventricle  demonstrates global hypokinesis. There is mild asymmetric left ventricular  hypertrophy of the septal segment.  Left ventricular diastolic function could not be evaluated. The average  left ventricular global longitudinal strain is -8.3 %. The global   longitudinal strain is abnormal.   2. Right ventricular systolic function is moderately reduced. The right  ventricular size is normal. Tricuspid regurgitation signal is inadequate  for assessing PA pressure.   3. The mitral valve is normal in structure. Mild to moderate mitral valve  regurgitation. No evidence of mitral stenosis.   4. The aortic valve is tricuspid. Aortic valve regurgitation is mild.  Mild to moderate aortic valve sclerosis/calcification is present, without  any evidence of aortic stenosis.   5. Aortic dilatation noted. There is mild dilatation of the aortic root,  measuring 39 mm.   6. The inferior vena cava is dilated in size with <50% respiratory  variability, suggesting right atrial pressure of 15 mmHg.   7. There is trivial pericardial effusion posterior to the left ventricle.   FINDINGS   Left Ventricle: Left ventricular ejection fraction, by estimation, is 35  to 40%. The left ventricle has moderately decreased function. The left  ventricle demonstrates global hypokinesis. The average left ventricular  global longitudinal strain is -8.3 %.   The global longitudinal strain is abnormal. The left ventricular internal  cavity size was normal in size. There is mild asymmetric left ventricular  hypertrophy of the septal segment. Abnormal (paradoxical) septal motion,  consistent with left bundle  branch block. Left ventricular diastolic function could not be evaluated  due to abnormal septal motion. Left ventricular diastolic function could  not be evaluated.   Right Ventricle: The right ventricular size is normal. No increase in  right ventricular wall thickness. Right ventricular systolic function is  moderately reduced. Tricuspid regurgitation signal is inadequate for  assessing PA pressure.   Left Atrium: Left atrial size was normal in size.   Right Atrium: Right atrial size was normal in size.   Pericardium: Trivial pericardial effusion is present. The  pericardial  effusion is posterior to the left ventricle.   Mitral Valve: The mitral valve is normal in structure. Mild to moderate  mitral valve regurgitation, with eccentric posteriorly directed jet. No  evidence of mitral valve stenosis.   Tricuspid Valve: The tricuspid valve is normal in structure. Tricuspid  valve regurgitation is mild . No evidence of tricuspid stenosis.   Aortic Valve: The aortic valve is tricuspid. Aortic valve regurgitation is  mild. Mild to moderate aortic valve sclerosis/calcification is present,  without any evidence of aortic stenosis.   Pulmonic Valve: The pulmonic valve was normal in structure. Pulmonic valve  regurgitation is trivial. No evidence of pulmonic stenosis.   Aorta: Aortic dilatation noted. There is mild dilatation of the aortic  root, measuring 39 mm.   Venous: The inferior vena cava is dilated in size with less than 50%  respiratory variability, suggesting right atrial pressure of 15 mmHg.   IAS/Shunts: No atrial level shunt detected by color flow Doppler.   EKG:  EKG is ordered today.  The ekg ordered today demonstrates NSR in the 60s  Recent Labs: 10/30/2020: ALT 25 11/04/2020: Magnesium 2.2  11/08/2020: BUN 24; Creatinine, Ser 0.86; Hemoglobin 9.0; Platelets 154; Potassium 3.6; Sodium 135  Recent Lipid Panel    Component Value Date/Time   CHOL 163 10/20/2020 0600   TRIG 44 10/20/2020 0600   HDL 58 10/20/2020 0600   CHOLHDL 2.8 10/20/2020 0600   VLDL 9 10/20/2020 0600   LDLCALC 96 10/20/2020 0600    Home Medications   Current Meds  Medication Sig   aspirin EC 81 MG tablet Take 1 tablet (81 mg total) by mouth daily. Swallow whole.   carvedilol (COREG) 3.125 MG tablet Take 1 tablet (3.125 mg total) by mouth 2 (two) times daily. PLEASE SCHEDULE OFFICE VISIT FOR FURTHER REFILLS. THANK YOU!   losartan (COZAAR) 25 MG tablet Take 1 tablet (25 mg total) by mouth daily.   Polyethyl Glycol-Propyl Glycol (SYSTANE OP) Place 1 drop  into both eyes daily as needed (dry eyes).   [DISCONTINUED] amiodarone (PACERONE) 200 MG tablet Take 2 tablets (400 mg total) by mouth 2 (two) times daily. X 3 days, then decrease to 200 mg BID x 7 days, then decrease to 200 mg daily     Review of Systems      All other systems reviewed and are otherwise negative except as noted above.  Physical Exam    VS:  BP (!) 151/73    Pulse 63    Ht 6\' 2"  (1.88 m)    Wt 157 lb (71.2 kg)    SpO2 97%    BMI 20.16 kg/m  , BMI Body mass index is 20.16 kg/m.  Wt Readings from Last 3 Encounters:  01/21/21 157 lb (71.2 kg)  12/02/20 152 lb (68.9 kg)  11/12/20 161 lb (73 kg)     GEN: Well nourished, well developed, in no acute distress. HEENT: normal. Neck: Supple, no JVD, carotid bruits, or masses. Cardiac: RRR, no murmurs, rubs, or gallops. No clubbing, cyanosis, edema.  Radials/PT 2+ and equal bilaterally.  Respiratory:  Respirations regular and unlabored, clear to auscultation bilaterally. GI: Soft, nontender, nondistended. MS: No deformity or atrophy. Skin: Warm and dry, no rash. Neuro:  Strength and sensation are intact. Psych: Normal affect.  Assessment & Plan    Thoracic aortic aneurysm status post ascending aortic aneurysm repair -post-op atrial fibrillation resolved with Amio -He can take the rest of his Amio and then discontinue since no recurrent  -Cardiopulmonary referral to [facility]  Acute HFrEF -Appears euvolemic on exam -Continue GDMT: Carvedilol 3.125 mg  Hypertension -Continue aggressive BP control -Start Losartan 25mg  daily for improved BP control -Please track your BP for the next two week and call our office with your readings  Hyperlipidemia -Recent lipid panel shows total cholesterol 163, HDL 58, LDL 96, triglycerides 44 10/20/2020 -Statin intolerant  Prostate cancer -Ongoing workup per Dr. Diona Fanti  6. Dementia/memory loss -He is seeing Neurology   Disposition: Follow up 6-8 weeks with  Nelva Bush, MD or APP.  Signed, Elgie Collard, PA-C 01/21/2021, 4:51 PM [facility]

## 2021-01-21 ENCOUNTER — Ambulatory Visit (INDEPENDENT_AMBULATORY_CARE_PROVIDER_SITE_OTHER): Payer: Medicare Other | Admitting: Physician Assistant

## 2021-01-21 ENCOUNTER — Other Ambulatory Visit: Payer: Self-pay

## 2021-01-21 VITALS — BP 151/73 | HR 63 | Ht 74.0 in | Wt 157.0 lb

## 2021-01-21 DIAGNOSIS — I5020 Unspecified systolic (congestive) heart failure: Secondary | ICD-10-CM | POA: Diagnosis not present

## 2021-01-21 DIAGNOSIS — C61 Malignant neoplasm of prostate: Secondary | ICD-10-CM | POA: Diagnosis not present

## 2021-01-21 DIAGNOSIS — E782 Mixed hyperlipidemia: Secondary | ICD-10-CM | POA: Diagnosis not present

## 2021-01-21 DIAGNOSIS — I10 Essential (primary) hypertension: Secondary | ICD-10-CM

## 2021-01-21 DIAGNOSIS — I719 Aortic aneurysm of unspecified site, without rupture: Secondary | ICD-10-CM | POA: Diagnosis not present

## 2021-01-21 DIAGNOSIS — Z79899 Other long term (current) drug therapy: Secondary | ICD-10-CM

## 2021-01-21 MED ORDER — LOSARTAN POTASSIUM 25 MG PO TABS: 25.0000 mg | ORAL_TABLET | Freq: Every day | ORAL | 1 refills | Status: DC

## 2021-01-21 NOTE — Patient Instructions (Addendum)
Medication Instructions:  - Your physician has recommended you make the following change in your medication:   1) START Losartan 25 mg: - take 1 tablet by mouth once daily   2) Continue your current dose of Amiodarone until you use up your current supply of tablets, then stop.  *If you need a refill on your cardiac medications before your next appointment, please call your pharmacy*   Lab Work: - Your physician recommends that you return for lab work in: 1 week- BMP  If you have labs (blood work) drawn today and your tests are completely normal, you will receive your results only by: [place] (if you have MyChart) OR A paper copy in the mail If you have any lab test that is abnormal or we need to change your treatment, we will call you to review the results.   Testing/Procedures: - You have been referred to Cardiac Rehab>> we will try to arrange for this to be done at [facility].   We will give you a call to confirm once we can speak with their Cardiac Rehab Department.     Follow-Up: At [facility], you and your health needs are our priority.  As part of our continuing mission to provide you with exceptional heart care, we have created designated Provider Care Teams.  These Care Teams include your primary Cardiologist (physician) and Advanced Practice Providers (APPs -  Physician Assistants and Nurse Practitioners) who all work together to provide you with the care you need, when you need it.  We recommend signing up for the patient portal called "MyChart".  Sign up information is provided on this After Visit Summary.  MyChart is used to connect with patients for Virtual Visits (Telemedicine).  Patients are able to view lab/test results, encounter notes, upcoming appointments, etc.  Non-urgent messages can be sent to your provider as well.   To learn more about what you can do with MyChart, go to [email or web address].    Your next appointment:   6-8  week(s)  The format for your next appointment:   In Person  Provider:   You may see Nelva Bush, MD or one of the following Advanced Practice Providers on your designated Care Team:   Murray Hodgkins, NP    Other Instructions  1) Monitor blood pressure readings at home at the same time every day and call with readings in the next 1-2 weeks  How to Take Your Blood Pressure Blood pressure is a measurement of how strongly your blood is pressing against the walls of your arteries. Arteries are blood vessels that carry blood from your heart throughout your body. Your health care provider takes your blood pressure at each office visit. You can also take your own blood pressure at home with a blood pressure monitor. You may need to take your own blood pressure to: Confirm a diagnosis of high blood pressure (hypertension). Monitor your blood pressure over time. Make sure your blood pressure medicine is working. Supplies needed: Blood pressure monitor. Dining room chair to sit in. Table or desk. Small notebook and pencil or pen. How to prepare To get the most accurate reading, avoid the following for 30 minutes before you check your blood pressure: Drinking caffeine. Drinking alcohol. Eating. Smoking. Exercising. Five minutes before you check your blood pressure: Use the bathroom and urinate so that you have an empty bladder. Sit quietly in a dining room chair. Do not sit in a soft couch or an armchair. Do not talk.  How to take your blood pressure To check your blood pressure, follow the instructions in the manual that came with your blood pressure monitor. If you have a digital blood pressure monitor, the instructions may be as follows: Sit up straight in a chair. Place your feet on the floor. Do not cross your ankles or legs. Rest your left arm at the level of your heart on a table or desk or on the arm of a chair. Pull up your shirt sleeve. Wrap the blood pressure cuff  around the upper part of your left arm, 1 inch (2.5 cm) above your elbow. It is best to wrap the cuff around bare skin. Fit the cuff snugly around your arm. You should be able to place only one finger between the cuff and your arm. Position the cord so that it rests in the bend of your elbow. Press the power button. Sit quietly while the cuff inflates and deflates. Read the digital reading on the monitor screen and write the numbers down (record them) in a notebook. Wait 2-3 minutes, then repeat the steps, starting at step 1. What does my blood pressure reading mean? A blood pressure reading consists of a higher number over a lower number. Ideally, your blood pressure should be below 120/80. The first ("top") number is called the systolic pressure. It is a measure of the pressure in your arteries as your heart beats. The second ("bottom") number is called the diastolic pressure. It is a measure of the pressure in your arteries as the heart relaxes. Blood pressure is classified into five stages. The following are the stages for adults who do not have a short-term serious illness or a chronic condition. Systolic pressure and diastolic pressure are measured in a unit called mm Hg (millimeters of mercury).  Normal Systolic pressure: below 297. Diastolic pressure: below 80. Elevated Systolic pressure: 989-211. Diastolic pressure: below 80. Hypertension stage 1 Systolic pressure: 941-740. Diastolic pressure: 81-44. Hypertension stage 2 Systolic pressure: 818 or above. Diastolic pressure: 90 or above. You can have elevated blood pressure or hypertension even if only the systolic or only the diastolic number in your reading is higher than normal. Follow these instructions at home: Medicines Take over-the-counter and prescription medicines only as told by your health care provider. Tell your health care provider if you are having any side effects from blood pressure medicine. General  instructions Check your blood pressure as often as recommended by your health care provider. Check your blood pressure at the same time every day. Take your monitor to the next appointment with your health care provider to make sure that: You are using it correctly. It provides accurate readings. Understand what your goal blood pressure numbers are. Keep all follow-up visits as told by your health care provider. This is important. General tips Your health care provider can suggest a reliable monitor that will meet your needs. There are several types of home blood pressure monitors. Choose a monitor that has an arm cuff. Do not choose a monitor that measures your blood pressure from your wrist or finger. Choose a cuff that wraps snugly around your upper arm. You should be able to fit only one finger between your arm and the cuff. You can buy a blood pressure monitor at most drugstores or online. Where to find more information American Heart Association: www.heart.org Contact a health care provider if: Your blood pressure is consistently high. Your blood pressure is suddenly low. Get help right away if: Your systolic blood  pressure is higher than 180. Your diastolic blood pressure is higher than 120. Summary Blood pressure is a measurement of how strongly your blood is pressing against the walls of your arteries. A blood pressure reading consists of a higher number over a lower number. Ideally, your blood pressure should be below 120/80. Check your blood pressure at the same time every day. Avoid caffeine, alcohol, smoking, and exercise for 30 minutes prior to checking your blood pressure. These agents can affect the accuracy of the blood pressure reading. This information is not intended to replace advice given to you by your health care provider. Make sure you discuss any questions you have with your health care provider. Document Revised: 11/07/2019 Document Reviewed: 12/22/2018 Elsevier  Patient Education  2022 [employer].

## 2021-01-22 ENCOUNTER — Telehealth: Payer: Self-pay | Admitting: Internal Medicine

## 2021-01-22 NOTE — Telephone Encounter (Signed)
[employer]

## 2021-01-26 ENCOUNTER — Other Ambulatory Visit: Payer: Self-pay | Admitting: Thoracic Surgery (Cardiothoracic Vascular Surgery)

## 2021-01-26 ENCOUNTER — Other Ambulatory Visit: Payer: Self-pay

## 2021-01-26 DIAGNOSIS — I10 Essential (primary) hypertension: Secondary | ICD-10-CM

## 2021-01-26 DIAGNOSIS — Z79899 Other long term (current) drug therapy: Secondary | ICD-10-CM

## 2021-01-26 DIAGNOSIS — I5020 Unspecified systolic (congestive) heart failure: Secondary | ICD-10-CM

## 2021-01-26 DIAGNOSIS — Z9889 Other specified postprocedural states: Secondary | ICD-10-CM

## 2021-01-26 DIAGNOSIS — Z8679 Personal history of other diseases of the circulatory system: Secondary | ICD-10-CM

## 2021-01-27 ENCOUNTER — Telehealth: Payer: Self-pay | Admitting: Physician Assistant

## 2021-01-27 ENCOUNTER — Ambulatory Visit (HOSPITAL_COMMUNITY): Payer: Medicare Other

## 2021-01-27 ENCOUNTER — Ambulatory Visit (INDEPENDENT_AMBULATORY_CARE_PROVIDER_SITE_OTHER): Payer: Self-pay | Admitting: Thoracic Surgery (Cardiothoracic Vascular Surgery)

## 2021-01-27 ENCOUNTER — Ambulatory Visit
Admission: RE | Admit: 2021-01-27 | Discharge: 2021-01-27 | Disposition: A | Discharge status: Home / Self Care | Payer: Medicare Other | Source: Ambulatory Visit | Attending: Thoracic Surgery (Cardiothoracic Vascular Surgery) | Admitting: Thoracic Surgery (Cardiothoracic Vascular Surgery)

## 2021-01-27 ENCOUNTER — Other Ambulatory Visit: Payer: Self-pay

## 2021-01-27 VITALS — BP 160/73 | HR 70 | Resp 20 | Ht 74.0 in | Wt 156.0 lb

## 2021-01-27 DIAGNOSIS — Z9889 Other specified postprocedural states: Secondary | ICD-10-CM

## 2021-01-27 DIAGNOSIS — Z8679 Personal history of other diseases of the circulatory system: Secondary | ICD-10-CM

## 2021-01-27 LAB — BASIC METABOLIC PANEL
BUN/Creatinine Ratio: 22 (ref 10–24)
BUN: 20 mg/dL (ref 8–27)
CO2: 27 mmol/L (ref 20–29)
Calcium: 9.4 mg/dL (ref 8.6–10.2)
Chloride: 104 mmol/L (ref 96–106)
Creatinine, Ser: 0.89 mg/dL (ref 0.76–1.27)
Glucose: 71 mg/dL (ref 70–99)
Potassium: 4.3 mmol/L (ref 3.5–5.2)
Sodium: 149 mmol/L — ABNORMAL HIGH (ref 134–144)
eGFR: 87 mL/min/{1.73_m2} (ref >59)

## 2021-01-27 NOTE — Progress Notes (Signed)
[address]       [place] [address]             [phone]     HPI: Mr. 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  Justin Contreras is a 81 year old man with a history of prostate cancer, hyperlipidemia, and an ascending and arch aneurysm.  He recently was diagnosed with recurrent prostate cancer.  A PET/CT was done.  It showed a sending aneurysm extending into the proximal aortic arch.  I did ascending and arch replacement under moderate hypothermic circulatory arrest on 11/03/2020.  Postoperatively had atrial fibrillation, but converted to sinus rhythm with amiodarone.  He is feeling well.  He occasionally has some pain in the chest area but is not taking any medication for that.  His primary complaint is memory problems.  He has had issues with that previously but was worsened after the surgery.  He says that his son visited him this weekend and said that he seemed better to him.  He has an appointment with neurology coming up in the next week or 2.   Current Outpatient Medications  Medication Sig Dispense Refill   aspirin EC 81 MG tablet Take 1 tablet (81 mg total) by mouth daily. Swallow whole. 90 tablet 0   carvedilol (COREG) 3.125 MG tablet Take 1 tablet (3.125 mg total) by mouth 2 (two) times daily. PLEASE SCHEDULE OFFICE VISIT FOR FURTHER REFILLS. THANK YOU! 60 tablet 0   losartan (COZAAR) 25 MG tablet Take 1 tablet (25 mg total) by mouth daily. 90 tablet 1   Polyethyl Glycol-Propyl Glycol (SYSTANE OP) Place 1 drop into both eyes daily as needed (dry eyes).     No current facility-administered medications for this visit.    Physical Exam BP (!) 160/73 (BP Location: Right Arm, Patient Position: Sitting)    Pulse 70    Resp 20    Ht 6\' 2"  (1.88 m)    Wt 156 lb (70.8 kg)    SpO2 95% Comment: RA   BMI 20.89 kg/m  81 year old man in no acute distress Alert and oriented x3, but some slowness  with response Lungs clear bilaterally Cardiac regular rate and rhythm normal S1 and S2 Sternum stable, incision well-healed  Diagnostic Tests: CHEST - 2 VIEW   COMPARISON:  Chest x-ray 12/02/2020   FINDINGS: The cardiac silhouette, mediastinal and hilar contours are normal and stable.   Stable scattered calcified pleural plaques. No acute pulmonary findings or pleural effusions. No pneumothorax.   IMPRESSION: No acute cardiopulmonary findings.     Electronically Signed   By: Justin Contreras M.D.   On: 01/27/2021 10:23 I personally reviewed the chest x-ray images.  Postoperative changes present.  No active disease.  Impression: Justin Contreras is a 81 year old man with a history of prostate cancer, hyperlipidemia, and an ascending and arch aneurysm.    Ascending/arch aneurysm-status post repair in October.  Will need continued surveillance of his aorta.  We will plan a CT angiogram of the chest and abdomen in September which will be a year from his last scan.  Memory loss-he is to see neurology in the near future about that.  He had some issues prior to surgery but apparently the surgery has exacerbated that problem.  He says that things come and go and he loses his train of thought.  Hopefully what ever component of that is due to the surgery will continue  to improve with time.  Prostate cancer-he has a follow-up with Dr. Diona Contreras tomorrow.   Plan: Follow-up with Dr. Diona Contreras as scheduled Follow-up with neurology as scheduled Return in 9 months with CT angio chest and abdomen to assess thoracic and abdominal aorta.  We will also follow-up on questionable pancreatic lesion seen on CT in September.  Melrose Nakayama, MD [facility] [phone]

## 2021-01-27 NOTE — Telephone Encounter (Signed)
Call received from [place]. I advised her of the patient's situation and that we were trying to see if we could possibly get him into rehab with them in [facility].  Per Helene Kelp, the # I was given was for [place].  She advised I will need to call the [facility] at [phone] directly to help set up the referral.  I have attempted to call the [facility]. No answer- I have left a message to please call back.

## 2021-01-27 NOTE — Telephone Encounter (Signed)
The patient was seen in the office on 01/21/21 by Nicholes Rough, PA.  He is s/p surgical repair of ascending aortic aneurysm on 11/03/20.  Per Johann Capers, the patient lives in [place] and will need a Cardiac Rehab referral there if possible.  I have attempted to contact the [facility] Cardiac Rehab department to see if it is possible to refer the patient there for rehab. No answer with the Cardiac Rehab department. I have left a message for them to please give me a call back to please discuss.

## 2021-01-29 ENCOUNTER — Telehealth: Payer: Self-pay

## 2021-01-29 NOTE — Telephone Encounter (Signed)
Attempted to reach out to Mr. Candelas, unable to make contact Will attempt again at later time [facility]

## 2021-01-29 NOTE — Telephone Encounter (Signed)
Call received from [facility] today stating that reviewing the patient's chart- Cardiac Rehab is typically not covered for ascending aorta repair by Medicare, unless the provider has some other diagnosis they want to use try to use. Per Delana Meyer- she was going to fax me a list of covered diagnoses that could be used.   List of diagnoses received.  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 Saunders Revel) to review.

## 2021-01-29 NOTE — Telephone Encounter (Signed)
Echocardiograms dating back to 09/2020 to demonstrate reduced LVEF (most recently 35-40%).  This is consistent with chronic systolic heart failure.  Hopefully, this will qualify him for cardiac rehab.

## 2021-01-30 NOTE — Telephone Encounter (Signed)
Patient notified of lab results and voices understanding.

## 2021-01-30 NOTE — Telephone Encounter (Signed)
Elgie Collard, PA-C  01/29/2021 10:24 AM EST     Please share with the patient:    Justin Contreras,    Your sodium on your labs is elevated. Are you abiding by your heart failure recommendations? Please maintain a low-sodium diet and proper hydration?    Take Care,    Elgie Collard, PA-C

## 2021-01-30 NOTE — Telephone Encounter (Signed)
Signed Cardiac Rehab orders for [facility] at [facility] have been signed by Dr. Saunders Revel with Chronic systolic HF diagnosis. Faxed to [phone] with records/ demographics. Confirmation received.   I have called and notified the patient of the above. He states that Cardiac Rehab has reached out to him already, just prior to my calling him.   He was appreciative of the call to follow up.

## 2021-02-03 ENCOUNTER — Ambulatory Visit: Payer: Medicare Other | Admitting: Thoracic Surgery (Cardiothoracic Vascular Surgery)

## 2021-02-06 DIAGNOSIS — R413 Other amnesia: Secondary | ICD-10-CM | POA: Insufficient documentation

## 2021-02-06 DIAGNOSIS — R419 Unspecified symptoms and signs involving cognitive functions and awareness: Secondary | ICD-10-CM | POA: Insufficient documentation

## 2021-02-09 ENCOUNTER — Other Ambulatory Visit: Payer: Self-pay | Admitting: Internal Medicine

## 2021-03-09 HISTORY — PX: MOHS SURGERY: SUR867

## 2021-03-11 ENCOUNTER — Other Ambulatory Visit: Payer: Self-pay

## 2021-03-11 ENCOUNTER — Encounter: Payer: Self-pay | Admitting: Nurse Practitioner

## 2021-03-11 ENCOUNTER — Ambulatory Visit (INDEPENDENT_AMBULATORY_CARE_PROVIDER_SITE_OTHER): Payer: Medicare Other | Admitting: Nurse Practitioner

## 2021-03-11 VITALS — BP 158/85 | HR 79 | Ht 74.0 in | Wt 161.0 lb

## 2021-03-11 DIAGNOSIS — I5022 Chronic systolic (congestive) heart failure: Secondary | ICD-10-CM | POA: Diagnosis not present

## 2021-03-11 DIAGNOSIS — I480 Paroxysmal atrial fibrillation: Secondary | ICD-10-CM

## 2021-03-11 DIAGNOSIS — I10 Essential (primary) hypertension: Secondary | ICD-10-CM

## 2021-03-11 DIAGNOSIS — Z9889 Other specified postprocedural states: Secondary | ICD-10-CM

## 2021-03-11 DIAGNOSIS — I428 Other cardiomyopathies: Secondary | ICD-10-CM | POA: Diagnosis not present

## 2021-03-11 DIAGNOSIS — Z8679 Personal history of other diseases of the circulatory system: Secondary | ICD-10-CM

## 2021-03-11 MED ORDER — CARVEDILOL 6.25 MG PO TABS: 6.2500 mg | ORAL_TABLET | Freq: Two times a day (BID) | ORAL | 1 refills | Status: DC

## 2021-03-11 NOTE — Patient Instructions (Signed)
Medication Instructions:  ?Your physician has recommended you make the following change in your medication:  ? ?INCREASE Carvedilol to 6.25 mg twice a day. An Rx has been sent to your pharmacy. ? ? ?*If you need a refill on your cardiac medications before your next appointment, please call your pharmacy* ? ? ?Lab Work: ?None ordered ?If you have labs (blood work) drawn today and your tests are completely normal, you will receive your results only by: ?MyChart Message (if you have MyChart) OR ?A paper copy in the mail ?If you have any lab test that is abnormal or we need to change your treatment, we will call you to review the results. ? ? ?Testing/Procedures: ?None ordered ? ? ?Follow-Up: ?At [facility], you and your health needs are our priority.  As part of our continuing mission to provide you with exceptional heart care, we have created designated Provider Care Teams.  These Care Teams include your primary Cardiologist (physician) and Advanced Practice Providers (APPs -  Physician Assistants and Nurse Practitioners) who all work together to provide you with the care you need, when you need it. ? ?We recommend signing up for the patient portal called "MyChart".  Sign up information is provided on this After Visit Summary.  MyChart is used to connect with patients for Virtual Visits (Telemedicine).  Patients are able to view lab/test results, encounter notes, upcoming appointments, etc.  Non-urgent messages can be sent to your provider as well.   ?To learn more about what you can do with MyChart, go to [email or web address].   ? ?Your next appointment:   ?3 month(s) ? ?The format for your next appointment:   ?In Person ? ?Provider:   ?Nelva Bush, MD{ ? ? ? ?Other Instructions ?N/A ? ?

## 2021-03-11 NOTE — Progress Notes (Signed)
Office Visit    Patient Name: TARREN Contreras Date of Encounter: 03/11/2021  Primary Care Provider:  Derrill Center., MD Primary Cardiologist:  Nelva Bush, MD  Chief Complaint    81 y/o ? w/ a h/o thoracic aortic aneurysm s/p repair (10/2020), postoperative atrial fibrillation, HTN, HL (statin intolerant), NICM (EF 35-40%), HFrEF, bronchiectasis status post left lung resection, memory loss, and prostate cancer, who presents for follow-up related to cardiomyopathy and hypertension.  Past Medical History    Past Medical History:  Diagnosis Date   Bronchiectasis ([place])    Dyspnea    Hyperlipidemia    Hypertension    NICM (nonischemic cardiomyopathy) ([place])    a. 09/2020 Echo: EF 45%, glob HK, GrI DD; b. 10/2020 Cath: Nl cors. Nl R heart filling pressures. Nl CO/CI; c. 10/2020 Echo: EF 35-40%, glob HK, mild asymm septal LVH, mod reduced RV fxn, mild-mod MR, mild AI, mild-mod AoV sclerosis.   Pneumonia    Prostate cancer [facility])    Thoracic aortic aneurysm    a. 09/2020 CTA Chest: 84mm Asc TAA; b. 10/2020 s/p 52mm Hemashield graft w/ separate anastomoses to innominate and L common carotid; c. 10/2020 Echo: Mild Ao root dil @ 14mm.   Past Surgical History:  Procedure Laterality Date   KNEE ARTHROSCOPY Left    2014   LOBECTOMY Left    Left lower lobectomy   mohs micrographic surgery nose     MOHS SURGERY Right 03/09/2021   Right ear   partial remove lung     2008   prostate surgery/transperinal placement of needles     RIGHT/LEFT HEART CATH AND CORONARY ANGIOGRAPHY N/A 10/20/2020   Procedure: RIGHT/LEFT HEART CATH AND CORONARY ANGIOGRAPHY;  Surgeon: Nelva Bush, MD;  Location: [facility];  Service: Cardiovascular;  Laterality: N/A;   TEE WITHOUT CARDIOVERSION N/A 11/03/2020   Procedure: TRANSESOPHAGEAL ECHOCARDIOGRAM (TEE);  Surgeon: Melrose Nakayama, MD;  Location: [place];  Service: Open Heart Surgery;  Laterality: N/A;   THORACIC AORTIC ANEURYSM REPAIR N/A  11/03/2020   Procedure: THORACIC ASCENDING ANEURYSM REPAIR (AAA) USING HEMASHIELD PLATINUM 32MM GRAFT;  Surgeon: Melrose Nakayama, MD;  Location: [place];  Service: Open Heart Surgery;  Laterality: N/A;  WITH CIRC ARREST    Allergies  Allergies  Allergen Reactions   Amoxicillin Other (See Comments)    Bad taste in mouth & tongue gets fuzzy feeling   Statins     Oral problems and bad taste Fatigue    History of Present Illness    81 year old male with the above past medical history including thoracic aortic aneurysm, postoperative atrial fibrillation, hypertension, hyperlipidemia, statin intolerance, nonischemic cardiomyopathy, HFrEF, bronchiectasis status post left lung resection, memory loss, and prostate cancer.  In mid 2022, in the setting of an elevated PSA and prior history of prostate cancer, he underwent PET/CT, which showed recurrence of prostate cancer and incidentally showed an ascending thoracic aortic aneurysm.  Follow-up CTA of the chest showed a 58 mm ascending thoracic aortic aneurysm.  He was seen by CT surgery and underwent echocardiography which showed an EF of 45% with global hypokinesis and grade 1 diastolic dysfunction.  He was experiencing dyspnea on exertion and was referred to Dr. Saunders Revel, and subsequently underwent right and left heart diagnostic catheterization in October 2022, revealing normal coronary arteries, filling pressures, and cardiac output/index.  He subsequently underwent ascending aortic and proximal arch aneurysm repair with a 32 mm Hemashield graft on November 03, 2020.  Postoperative course  was complicated by atrial fibrillation requiring initiation of amiodarone therapy with subsequent conversion to sinus rhythm.  Follow-up echo postoperatively showed EF 35 to 40% with global hypokinesis, trivial pericardial effusion, and mild aortic root dilatation at 39 mm.  Justin Contreras was last seen in cardiology clinic on January 11,, at which time he was feeling well.   Blood pressure was elevated and losartan therapy was added.  Follow-up labs on January 16 showed stable renal function and potassium.  Since his last visit, he has mostly done quite well in cardiac rehab in [facility].  He does not experience chest pain or dyspnea and denies palpitations, PND, orthopnea, syncope, edema, or early satiety.  He did have episode of dizziness about a week ago, which passed within a few seconds.  It occurred while he was sitting.  He does not have much recollection as to exactly what happened but there was no loss of consciousness.  He and his significant other continues to note worsening of memory deficits and forgetfulness.  He is scheduled to see neurology.  His blood pressures have been trending in the 130s to 160s at home.  He is 150/80 today.  Home Medications    Current Outpatient Medications  Medication Sig Dispense Refill   aspirin EC 81 MG tablet Take 1 tablet (81 mg total) by mouth daily. Swallow whole. 90 tablet 0   ferrous sulfate 325 (65 FE) MG EC tablet Take 325 mg by mouth daily with breakfast.     losartan (COZAAR) 25 MG tablet Take 1 tablet (25 mg total) by mouth daily. 90 tablet 1   Polyethyl Glycol-Propyl Glycol (SYSTANE OP) Place 1 drop into both eyes daily as needed (dry eyes).     carvedilol (COREG) 6.25 MG tablet Take 1 tablet (6.25 mg total) by mouth 2 (two) times daily. 180 tablet 1   No current facility-administered medications for this visit.     Review of Systems    1 episode of dizziness just over a week ago, which was brief in nature.  He denies chest pain, palpitations, PND, orthopnea, syncope, edema, claudication, or early satiety.  He is concerned about ongoing memory loss.  All other systems reviewed and are otherwise negative except as noted above.    Physical Exam    VS:  BP (!) 150/80 (BP Location: Left Arm, Patient Position: Sitting, Cuff Size: Normal)    Pulse 79    Ht 6\' 2"  (1.88 m)    Wt 161 lb (73 kg)    SpO2 97%    BMI  20.67 kg/m  , BMI Body mass index is 20.67 kg/m.     Vitals:   03/11/21 1522 03/11/21 1713  BP: (!) 150/80 (!) 158/85  Pulse: 79   SpO2: 97%     GEN: Well nourished, well developed, in no acute distress. HEENT: normal. Neck: Supple, no JVD, carotid bruits, or masses. Cardiac: RRR, no murmurs, rubs, or gallops. No clubbing, cyanosis, edema.  Radials/PT 2+ and equal bilaterally.  Respiratory:  Respirations regular and unlabored, clear to auscultation bilaterally. GI: Soft, nontender, nondistended, BS + x 4. MS: no deformity or atrophy. Skin: warm and dry, no rash. Neuro:  Strength and sensation are intact. Psych: Normal affect.  Accessory Clinical Findings    Lab Results  Component Value Date   WBC 8.7 11/08/2020   HGB 9.0 (L) 11/08/2020   HCT 26.2 (L) 11/08/2020   MCV 92.3 11/08/2020   PLT 154 11/08/2020   Lab Results  Component  Value Date   ALT 25 10/30/2020   AST 27 10/30/2020   ALKPHOS 70 10/30/2020   BILITOT 0.9 10/30/2020   Lab Results  Component Value Date   CHOL 163 10/20/2020   HDL 58 10/20/2020   LDLCALC 96 10/20/2020   TRIG 44 10/20/2020   CHOLHDL 2.8 10/20/2020    Lab Results  Component Value Date   HGBA1C 5.8 (H) 10/30/2020    Assessment & Plan    1.  Essential hypertension: He is currently on carvedilol 3.125 mg twice daily and losartan 25 mg daily was added at his last visit.  Blood pressures have been trending in the 130s to 160s at home.  He is 150/80 today and 158/85 on repeat.  I am going to increase his carvedilol to 6.25 mg twice daily.  Continue current dose of losartan.  He will continue to follow blood pressure at home and contact us if pressures continue to trend greater than 374 systolic, at which time we can consider further titration of losartan.  2.  Nonischemic cardiomyopathy/chronic HFrEF: EF 35 to 40% by echo in the postoperative setting.  He is euvolemic on examination and doing well without dyspnea.  He remains in on beta-blocker  and ARB therapy.  We will consider repeat echocardiogram in the future to reevaluate LV function.  3.  Postoperative atrial fibrillation: He was on a short course of amiodarone in the postoperative setting and this was discontinued in early February.  No known recurrence of atrial fibrillation.  He has not been anticoagulated.  4.  Thoracic aortic aneurysm status post repair: Patient has been doing well and tolerating cardiac rehab well, at [facility].  Postoperative echocardiogram showed mildly dilated aortic root at 39 mm.  Adjusting blood pressure medication in the setting of 1.  5.  Hyperlipidemia: He is statin intolerant however, his LDL was 96 in October 2022 and he had normal coronary arteries on catheterization at that time.  6.  Memory loss: Progressive since his surgery.  Follow-up pending with neurology.  7.  Disposition: Patient will continue to follow blood pressures at home.  Otherwise plan for follow-up in 3 months or sooner if necessary.  Murray Hodgkins, NP 03/11/2021, 5:10 PM

## 2021-03-16 ENCOUNTER — Other Ambulatory Visit: Payer: Self-pay | Admitting: Internal Medicine

## 2021-03-29 ENCOUNTER — Other Ambulatory Visit: Payer: Self-pay | Admitting: Physician Assistant

## 2021-06-10 ENCOUNTER — Ambulatory Visit (INDEPENDENT_AMBULATORY_CARE_PROVIDER_SITE_OTHER): Payer: Medicare Other | Admitting: Internal Medicine

## 2021-06-10 ENCOUNTER — Encounter: Payer: Self-pay | Admitting: Internal Medicine

## 2021-06-10 VITALS — BP 144/82 | HR 73 | Ht 74.0 in | Wt 167.0 lb

## 2021-06-10 DIAGNOSIS — I480 Paroxysmal atrial fibrillation: Secondary | ICD-10-CM

## 2021-06-10 DIAGNOSIS — I7121 Aneurysm of the ascending aorta, without rupture: Secondary | ICD-10-CM | POA: Diagnosis not present

## 2021-06-10 DIAGNOSIS — G309 Alzheimer's disease, unspecified: Secondary | ICD-10-CM

## 2021-06-10 DIAGNOSIS — F0280 Dementia in other diseases classified elsewhere without behavioral disturbance: Secondary | ICD-10-CM

## 2021-06-10 DIAGNOSIS — I428 Other cardiomyopathies: Secondary | ICD-10-CM

## 2021-06-10 DIAGNOSIS — I9789 Other postprocedural complications and disorders of the circulatory system, not elsewhere classified: Secondary | ICD-10-CM

## 2021-06-10 DIAGNOSIS — I10 Essential (primary) hypertension: Secondary | ICD-10-CM | POA: Diagnosis not present

## 2021-06-10 DIAGNOSIS — I4891 Unspecified atrial fibrillation: Secondary | ICD-10-CM

## 2021-06-10 DIAGNOSIS — I5022 Chronic systolic (congestive) heart failure: Secondary | ICD-10-CM

## 2021-06-10 DIAGNOSIS — I719 Aortic aneurysm of unspecified site, without rupture: Secondary | ICD-10-CM

## 2021-06-10 MED ORDER — LOSARTAN POTASSIUM 50 MG PO TABS: 50.0000 mg | ORAL_TABLET | Freq: Every day | ORAL | 0 refills | Status: DC

## 2021-06-10 MED ORDER — EZETIMIBE 10 MG PO TABS: 10.0000 mg | ORAL_TABLET | Freq: Every day | ORAL | 0 refills | Status: DC

## 2021-06-10 NOTE — Patient Instructions (Signed)
Medication Instructions:   Your physician has recommended you make the following change in your medication:   START Zetia (ezetimibe) 10 mg daily   INCREASE Losartan 50 mg daily - A new Rx has been sent to your pharmacy (you may take 2 tablets of current dose for total of 50 mg daily until pick up Rx)  *If you need a refill on your cardiac medications before your next appointment, please call your pharmacy*   Lab Work:  On the same day as your echocardiogram - labs to be completed at the medical mall at [facility])   - You do NOT have to fast for these labs  -  Please go to the [facility] [place] in at the Registration Desk: 1st desk to the right, past the screening table -  Valet Parking is offered if needed -  No appointment needed -  Lab hours: Monday- Friday (7:30 am- 5:30 pm)   Testing/Procedures:  Your physician has requested that you have an echocardiogram (in 2-3 weeks). Echocardiography is a painless test that uses sound waves to create images of your heart. It provides your doctor with information about the size and shape of your heart and how well your heart's chambers and valves are working. This procedure takes approximately one hour. There are no restrictions for this procedure.   Follow-Up: At [facility], you and your health needs are our priority.  As part of our continuing mission to provide you with exceptional heart care, we have created designated Provider Care Teams.  These Care Teams include your primary Cardiologist (physician) and Advanced Practice Providers (APPs -  Physician Assistants and Nurse Practitioners) who all work together to provide you with the care you need, when you need it.  We recommend signing up for the patient portal called "MyChart".  Sign up information is provided on this After Visit Summary.  MyChart is used to connect with patients for Virtual Visits (Telemedicine).  Patients are able to view lab/test results,  encounter notes, upcoming appointments, etc.  Non-urgent messages can be sent to your provider as well.   To learn more about what you can do with MyChart, go to [email or web address].    Your next appointment:   3 month(s)  The format for your next appointment:   In Person  Provider:   You may see Nelva Bush, MD or one of the following Advanced Practice Providers on your designated Care Team:   Murray Hodgkins, NP Christell Faith, PA-C Cadence Kathlen Mody, PA-C{   Important Information About Sugar

## 2021-06-10 NOTE — Progress Notes (Unsigned)
Follow-up Outpatient Visit Date: 06/10/2021  Primary Care Provider: Derrill Center., MD [place] [id number]  Chief Complaint: Follow-up cardiomyopathy  HPI:  Mr. Hinze is a 81 y.o. 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  He was last seen in our office in March by Ignacia Bayley, NP, at which time he was doing fairly well.  He was participating in cardiac rehab in [facility].  He noted 1 episode of transient dizziness but otherwise was without symptoms.  Carvedilol was increased to 6.25 mg twice daily due to elevated blood pressure.  Today, Mr. Bitter is feeling fairly well.  His main complaints surround his prostate and some urinary frequency.  He also notes that he was recently diagnosed with Alzheimer's disease and was started on donepezil.  He reports that his neurologist is considering addition of a second medication to help with his Alzheimer's.  From a heart standpoint, he has been feeling well.  He reports sporadic "twinges" of pain in the chest but no exertional chest discomfort.  He also denies shortness of breath, palpitations, lightheadedness, and edema.  He has graduated from cardiac rehab and is planning to begin exercising regularly at the gym.  --------------------------------------------------------------------------------------------------  Past Medical History:  Diagnosis Date   Bronchiectasis ([place])    Dyspnea    Hyperlipidemia    Hypertension    NICM (nonischemic cardiomyopathy) ([place])    a. 09/2020 Echo: EF 45%, glob HK, GrI DD; b. 10/2020 Cath: Nl cors. Nl R heart filling pressures. Nl CO/CI; c. 10/2020 Echo: EF 35-40%, glob HK, mild asymm septal LVH, mod reduced RV fxn, mild-mod MR, mild AI,  mild-mod AoV sclerosis.   Pneumonia    Prostate cancer [facility])    Thoracic aortic aneurysm ([place])    a. 09/2020 CTA Chest: 39m Asc TAA; b. 10/2020 s/p 380mHemashield graft w/ separate anastomoses to innominate and L common carotid; c. 10/2020 Echo: Mild Ao root dil @ 3938m  Past Surgical History:  Procedure Laterality Date   KNEE ARTHROSCOPY Left    2014   LOBECTOMY Left    Left lower lobectomy   mohs micrographic surgery nose     MOHS SURGERY Right 03/09/2021   Right ear   partial remove lung     2008   prostate surgery/transperinal placement of needles     RIGHT/LEFT HEART CATH AND CORONARY ANGIOGRAPHY N/A 10/20/2020   Procedure: RIGHT/LEFT HEART CATH AND CORONARY ANGIOGRAPHY;  Surgeon: EndNelva BushD;  Location: [facility] [place] [facility];  Service: Cardiovascular;  Laterality: N/A;   TEE WITHOUT CARDIOVERSION N/A 11/03/2020   Procedure: TRANSESOPHAGEAL ECHOCARDIOGRAM (TEE);  Surgeon: HenMelrose NakayamaD;  [facility] [place];  Laterality: N/A;   THORACIC AORTIC ANEURYSM REPAIR N/A 11/03/2020   Procedure: THORACIC ASCENDING ANEURYSM REPAIR (AAA) USING HEMASHIELD PLATINUM 32MM GRAFT;  Surgeon: HenMelrose NakayamaD;  [facility] [place];  Laterality: N/A;  WITH CIRC ARREST    Current Meds  Medication Sig   aspirin EC 81 MG tablet Take 1 tablet (81 mg total) by mouth daily. Swallow whole.   B Complex-C (B-COMPLEX WITH VITAMIN C) tablet Take 1 tablet by mouth daily.   carvedilol (COREG) 6.25 MG tablet Take 1 tablet (6.25 mg total) by  mouth 2 (two) times daily.   donepezil (ARICEPT) 10 MG tablet Take 10 mg by mouth daily.   ferrous sulfate 325 (65 FE) MG EC tablet Take 325 mg by mouth daily with breakfast.   losartan (COZAAR) 25 MG tablet Take 1 tablet (25 mg total) by mouth daily.   NON FORMULARY [place] gummies-2 PO daily   Pediatric Multiple Vitamins (FLINTSTONES MULTIVITAMIN PO) Take 1 tablet by mouth daily.   Polyethyl  Glycol-Propyl Glycol (SYSTANE OP) Place 1 drop into both eyes daily as needed (dry eyes).    Allergies: Amoxicillin and Statins  Social History   Tobacco Use   Smoking status: Former    Years: 10.00    Types: Cigarettes    Quit date: 1963    Years since quitting: 60.4   Smokeless tobacco: Never  Vaping Use   Vaping Use: Never used  Substance Use Topics   Alcohol use: Not Currently   Drug use: Never    Family History  Problem Relation Age of Onset   Parkinson's disease Mother    Cancer Father    Cancer - Prostate Father     Review of Systems: A 12-system review of systems was performed and was negative except as noted in the HPI.  --------------------------------------------------------------------------------------------------  Physical Exam: BP (!) 144/82 (BP Location: Left Arm, Patient Position: Sitting, Cuff Size: Large)   Pulse 73   Ht '6\' 2"'$  (1.88 m)   Wt 167 lb (75.8 kg)   SpO2 95%   BMI 21.44 kg/m   General:  NAD. Neck: No JVD or HJR. Lungs: Clear to auscultation bilaterally without wheezes or crackles. Heart: Regular rate and rhythm with 1/6 systolic murmur.  No rubs or gallops.  Median sternotomy incision well-healed. Abdomen: Soft, nontender, nondistended. Extremities: No lower extremity edema.  EKG: Sinus rhythm with LVH and abnormal repolarization.  No significant change from prior tracing on 01/21/2021.  Lab Results  Component Value Date   WBC 8.7 11/08/2020   HGB 9.0 (L) 11/08/2020   HCT 26.2 (L) 11/08/2020   MCV 92.3 11/08/2020   PLT 154 11/08/2020    Lab Results  Component Value Date   NA 149 (H) 01/26/2021   K 4.3 01/26/2021   CL 104 01/26/2021   CO2 27 01/26/2021   BUN 20 01/26/2021   CREATININE 0.89 01/26/2021   GLUCOSE 71 01/26/2021   ALT 25 10/30/2020    Lab Results  Component Value Date   CHOL 163 10/20/2020   HDL 58 10/20/2020   LDLCALC 96 10/20/2020   TRIG 44 10/20/2020   CHOLHDL 2.8 10/20/2020     --------------------------------------------------------------------------------------------------  ASSESSMENT AND PLAN: Chronic HFrEF due to nonischemic cardiomyopathy: Mr. Gage feels well from a heart standpoint and appears euvolemic on examination today, consistent with NYHA class I heart failure.  Given that his blood pressure remains mildly elevated today, we have agreed to increase losartan to 50 mg daily.  We will arrange for a follow-up echocardiogram in the next few weeks to reassess Mr. Steege LVEF.  We will plan to repeat a BMP at that time to ensure stable renal function and potassium with escalation of losartan today.  Continue carvedilol 6.25 mg twice daily.  Thoracic aortic aneurysm status postrepair: Ongoing follow-up through Dr. Roxan Hockey.  Continue aspirin for secondary prevention.  Given statin intolerance, we have agreed to add ezetimibe 10 mg daily.  Lipid panel and LFTs will need to be checked when Mr. Weekes follows up in 3 months.  Hypertension: Blood pressure mildly  elevated today despite escalation of carvedilol at Mr. Rother last visit.  As above, we will continue carvedilol 6.25 mg twice daily and increase losartan to 50 mg daily with follow-up BMP in a few weeks.  Postoperative atrial fibrillation: No further atrial fibrillation noted.  We will defer further medication changes including anticoagulation.  Alzheimer's disease: Progressive decline in memory previously noted following aortic surgery.  Mr. Smiles was recently diagnosed with Alzheimer's disease and is now on donepezil.  He should continue following with his neurologist.  Follow-up: Return to clinic in 3 months.  Nelva Bush, MD 06/10/2021 4:02 PM

## 2021-06-11 ENCOUNTER — Encounter: Payer: Self-pay | Admitting: Internal Medicine

## 2021-06-11 DIAGNOSIS — I9789 Other postprocedural complications and disorders of the circulatory system, not elsewhere classified: Secondary | ICD-10-CM | POA: Insufficient documentation

## 2021-06-11 DIAGNOSIS — F0280 Dementia in other diseases classified elsewhere without behavioral disturbance: Secondary | ICD-10-CM | POA: Insufficient documentation

## 2021-06-11 DIAGNOSIS — I10 Essential (primary) hypertension: Secondary | ICD-10-CM | POA: Insufficient documentation

## 2021-06-11 DIAGNOSIS — I5022 Chronic systolic (congestive) heart failure: Secondary | ICD-10-CM | POA: Insufficient documentation

## 2021-06-11 DIAGNOSIS — I428 Other cardiomyopathies: Secondary | ICD-10-CM | POA: Insufficient documentation

## 2021-06-26 ENCOUNTER — Ambulatory Visit (INDEPENDENT_AMBULATORY_CARE_PROVIDER_SITE_OTHER): Payer: Medicare Other

## 2021-06-26 ENCOUNTER — Other Ambulatory Visit
Admission: RE | Admit: 2021-06-26 | Discharge: 2021-06-26 | Disposition: A | Discharge status: Home / Self Care | Payer: Medicare Other | Attending: Internal Medicine | Admitting: Internal Medicine

## 2021-06-26 ENCOUNTER — Telehealth: Payer: Self-pay | Admitting: Internal Medicine

## 2021-06-26 DIAGNOSIS — I5022 Chronic systolic (congestive) heart failure: Secondary | ICD-10-CM | POA: Diagnosis present

## 2021-06-26 LAB — BASIC METABOLIC PANEL
Anion gap: 4 — ABNORMAL LOW (ref 5–15)
BUN: 22 mg/dL (ref 8–23)
CO2: 32 mmol/L (ref 22–32)
Calcium: 9.4 mg/dL (ref 8.9–10.3)
Chloride: 105 mmol/L (ref 98–111)
Creatinine, Ser: 0.62 mg/dL (ref 0.61–1.24)
GFR, Estimated: >60 mL/min (ref >60)
Glucose, Bld: 85 mg/dL (ref 70–99)
Potassium: 3.9 mmol/L (ref 3.5–5.1)
Sodium: 141 mmol/L (ref 135–145)

## 2021-06-26 LAB — ECHOCARDIOGRAM COMPLETE
Area-P 1/2: 3.05 cm2
MV M vel: 5.24 m/s
MV Peak grad: 109.8 mmHg
P 1/2 time: 684 msec
Radius: 0.8 cm
S' Lateral: 4.2 cm

## 2021-06-26 NOTE — Telephone Encounter (Signed)
Patient came by office Dropped off medication list to be reviewed Placed in nurse box

## 2021-06-26 NOTE — Telephone Encounter (Addendum)
Received pt's med list.  Reviewed along with med list in chart.  Updated pt's med list, with additional medication: Sildosin 10 mg   Pt also notes Losartan 25 mg which is the dose he was previously taking prior to last office visit.  At last office visit 06/10/21 Dr. Saunders Revel increased Losartan to 50 mg daily.  Pt had his repeat BMET today (for 2 weeks after dose incr).  Attempted to call pt to confirm that he has been taking Losartan 50 mg daily.   No answer. Lmtcb.

## 2021-06-29 ENCOUNTER — Other Ambulatory Visit: Payer: Self-pay | Admitting: Urology

## 2021-06-29 NOTE — Telephone Encounter (Signed)
End, Harrell Gave, MD  Solmon Ice, RN Please let Mr. Granada know that his kidney function and electrolytes are normal.  His echo shows that his heart is still moderately weak, similar to the prior echo in October.  I recommend that he continue his current medications and follow-up as discussed at hour last office visit.   Attempted to call pt. No answer. Lmtcb.   Will discuss message below and lab/echo results on return call.

## 2021-06-30 NOTE — Telephone Encounter (Signed)
Called and spoke with pt. Clarified pt's med list.  Pt states he is actually taking Sildosin 8 mg qhs and does confirm he is taking Losartan 50 mg daily. Pt did not update his list when we incr dose at last office visit.  Med list has been updated.   The patient has been notified of lab and echo results and verbalized understanding.  All questions (if any) were answered.

## 2021-07-01 NOTE — Patient Instructions (Signed)
DUE TO COVID-19 ONLY TWO VISITORS  (aged 80 and older)  ARE ALLOWED TO COME WITH YOU AND STAY IN THE WAITING ROOM ONLY DURING PRE OP AND PROCEDURE.   **NO VISITORS ARE ALLOWED IN THE SHORT STAY AREA OR RECOVERY ROOM!!**  IF YOU WILL BE ADMITTED INTO THE HOSPITAL YOU ARE ALLOWED ONLY FOUR SUPPORT PEOPLE DURING VISITATION HOURS ONLY (7 AM -8PM)   The support person(s) must pass our screening, gel in and out, and wear a mask at all times, including in the patient's room. Patients must also wear a mask when staff or their support person are in the room. Visitors GUEST BADGE MUST BE WORN VISIBLY  One adult visitor may remain with you overnight and MUST be in the room by 8 P.M.     Your procedure is scheduled on: 07/09/21   Report to [facility] Main Entrance    Report to admitting at : 10:45 AM   Call this number if you have problems the morning of surgery [phone]   Do not eat food :After Midnight.   After Midnight you may have the following liquids until : 10:00 AM DAY OF SURGERY  Water Black Coffee (sugar ok, NO MILK/CREAM OR CREAMERS)  Tea (sugar ok, NO MILK/CREAM OR CREAMERS) regular and decaf                             Plain Jell-O (NO RED)                                           Fruit ices (not with fruit pulp, NO RED)                                     Popsicles (NO RED)                                                                  Juice: apple, WHITE grape, WHITE cranberry Sports drinks like Gatorade (NO RED) Clear broth(vegetable,chicken,beef)  Oral Hygiene is also important to reduce your risk of infection.                                    Remember - BRUSH YOUR TEETH THE MORNING OF SURGERY WITH YOUR REGULAR TOOTHPASTE   Do NOT smoke after Midnight   Take these medicines the morning of surgery with A SIP OF WATER: carvedilol,donepezil.  DO NOT TAKE ANY ORAL DIABETIC MEDICATIONS DAY OF YOUR SURGERY  Bring CPAP mask and tubing day of surgery.                               You may not have any metal on your body including hair pins, jewelry, and body piercing             Do not wear lotions, powders, perfumes/cologne, or deodorant  Men may shave face and neck.   Do not bring valuables to the hospital. [place].   Contacts, dentures or bridgework may not be worn into surgery.   Bring small overnight bag day of surgery.   DO NOT [place]. PHARMACY WILL DISPENSE MEDICATIONS LISTED ON YOUR MEDICATION LIST TO YOU DURING YOUR ADMISSION [place]!    Patients discharged on the day of surgery will not be allowed to drive home.  Someone NEEDS to stay with you for the first 24 hours after anesthesia.   Special Instructions: Bring a copy of your healthcare power of attorney and living will documents         the day of surgery if you haven't scanned them before.              Please read over the following fact sheets you were given: IF YOU HAVE QUESTIONS ABOUT YOUR PRE-OP INSTRUCTIONS PLEASE CALL [phone]     [facility] - Preparing for Surgery Before surgery, you can play an important role.  Because skin is not sterile, your skin needs to be as free of germs as possible.  You can reduce the number of germs on your skin by washing with CHG (chlorahexidine gluconate) soap before surgery.  CHG is an antiseptic cleaner which kills germs and bonds with the skin to continue killing germs even after washing. Please DO NOT use if you have an allergy to CHG or antibacterial soaps.  If your skin becomes reddened/irritated stop using the CHG and inform your nurse when you arrive at Short Stay. Do not shave (including legs and underarms) for at least 48 hours prior to the first CHG shower.  You may shave your face/neck. Please follow these instructions carefully:  1.  Shower with CHG Soap the night before surgery and the  morning of Surgery.  2.  If you  choose to wash your hair, wash your hair first as usual with your  normal  shampoo.  3.  After you shampoo, rinse your hair and body thoroughly to remove the  shampoo.                           4.  Use CHG as you would any other liquid soap.  You can apply chg directly  to the skin and wash                       Gently with a scrungie or clean washcloth.  5.  Apply the CHG Soap to your body ONLY FROM THE NECK DOWN.   Do not use on face/ open                           Wound or open sores. Avoid contact with eyes, ears mouth and genitals (private parts).                       Wash face,  Genitals (private parts) with your normal soap.             6.  Wash thoroughly, paying special attention to the area where your surgery  will be performed.  7.  Thoroughly rinse your body with warm water from the neck down.  8.  DO NOT shower/wash with your normal soap after using and rinsing off  the CHG Soap.                9.  Pat yourself dry with a clean towel.            10.  Wear clean pajamas.            11.  Place clean sheets on your bed the night of your first shower and do not  sleep with pets. Day of Surgery : Do not apply any lotions/deodorants the morning of surgery.  Please wear clean clothes to the hospital/surgery center.  FAILURE TO FOLLOW THESE INSTRUCTIONS MAY RESULT IN THE CANCELLATION OF YOUR SURGERY PATIENT SIGNATURE_________________________________  NURSE SIGNATURE__________________________________  ________________________________________________________________________

## 2021-07-02 ENCOUNTER — Encounter (HOSPITAL_COMMUNITY)
Admission: RE | Admit: 2021-07-02 | Discharge: 2021-07-02 | Disposition: A | Discharge status: Home / Self Care | Payer: Medicare Other | Source: Ambulatory Visit | Attending: Urology | Admitting: Urology

## 2021-07-02 ENCOUNTER — Encounter (HOSPITAL_COMMUNITY): Payer: Self-pay

## 2021-07-02 ENCOUNTER — Other Ambulatory Visit: Payer: Self-pay

## 2021-07-02 DIAGNOSIS — I5022 Chronic systolic (congestive) heart failure: Secondary | ICD-10-CM | POA: Diagnosis not present

## 2021-07-02 DIAGNOSIS — Z01812 Encounter for preprocedural laboratory examination: Secondary | ICD-10-CM | POA: Insufficient documentation

## 2021-07-02 DIAGNOSIS — I428 Other cardiomyopathies: Secondary | ICD-10-CM | POA: Diagnosis not present

## 2021-07-02 DIAGNOSIS — I10 Essential (primary) hypertension: Secondary | ICD-10-CM | POA: Diagnosis not present

## 2021-07-02 DIAGNOSIS — I712 Thoracic aortic aneurysm, without rupture, unspecified: Secondary | ICD-10-CM | POA: Insufficient documentation

## 2021-07-02 DIAGNOSIS — Z87891 Personal history of nicotine dependence: Secondary | ICD-10-CM | POA: Insufficient documentation

## 2021-07-02 DIAGNOSIS — N320 Bladder-neck obstruction: Secondary | ICD-10-CM | POA: Diagnosis not present

## 2021-07-02 HISTORY — DX: Cardiomyopathy, unspecified: I42.9

## 2021-07-02 HISTORY — DX: Cardiac arrhythmia, unspecified: I49.9

## 2021-07-02 NOTE — Progress Notes (Signed)
For Short Stay: [place] appointment date: Date of COVID positive in last 44 days:  Bowel Prep reminder:   For Anesthesia: PCP - Dr. Derrill Center Cardiologist - Dr. Harrell Gave End. LOV: 06/10/21  Chest x-ray - 01/27/21 EKG - 06/10/21 Stress Test -  ECHO - 06/26/21 Cardiac Cath -  Pacemaker/ICD device last checked: Pacemaker orders received: Device Rep notified:  Spinal Cord Stimulator:  Sleep Study -  CPAP -   Fasting Blood Sugar -  Checks Blood Sugar _____ times a day Date and result of last Hgb A1c-  Blood Thinner Instructions: Aspirin Instructions: Last Dose:  Activity level: Can go up a flight of stairs and activities of daily living without stopping and without chest pain and/or shortness of breath   Able to exercise without chest pain and/or shortness of breath   Unable to go up a flight of stairs without chest pain and/or shortness of breath     Anesthesia review: Hx: HTN,Thoracic aorta aneurism,Afib,cardiomyopathy.  Patient denies shortness of breath, fever, cough and chest pain at PAT appointment   Patient verbalized understanding of instructions that were given to them at the PAT appointment. Patient was also instructed that they will need to review over the PAT instructions again at home before surgery.

## 2021-07-03 NOTE — Anesthesia Preprocedure Evaluation (Addendum)
Anesthesia Evaluation  Patient identified by MRN, date of birth, ID band  Reviewed: Allergy & Precautions, NPO status , Patient's Chart, lab work & pertinent test results, reviewed documented beta blocker date and time   Airway Mallampati: III  TM Distance: >3 FB Neck ROM: Full    Dental  (+) Teeth Intact, Dental Advisory Given   Pulmonary COPD, former smoker,  Quit smoking 1963   Pulmonary exam normal breath sounds clear to auscultation       Cardiovascular hypertension (167/73 in preop), Pt. on medications and Pt. on home beta blockers +CHF (LVEF35-40%)  Normal cardiovascular exam+ Valvular Problems/Murmurs (mild MR, mild AI) MR and AI  Rhythm:Regular Rate:Normal  thoracic aortic aneurysm s/p repair 10/2020  Echo 06/26/2021 1. Left ventricular ejection fraction, by estimation, is 35 to 40%. The  left ventricle has moderately decreased function. The left ventricle  demonstrates global hypokinesis. There is moderate left ventricular  hypertrophy. Left ventricular diastolic  parameters are consistent with Grade I diastolic dysfunction (impaired  relaxation). The average left ventricular global longitudinal strain is  -14.1 %.  2. Right ventricular systolic function is normal. The right ventricular  size is normal. There is normal pulmonary artery systolic pressure. The  estimated right ventricular systolic pressure is 57.8 mmHg.  3. Left atrial size was moderately dilated.  4. The mitral valve is normal in structure. Mild mitral valve  regurgitation. No evidence of mitral stenosis.  5. The aortic valve is tricuspid. Aortic valve regurgitation is mild.  Aortic valve sclerosis is present, with no evidence of aortic valve  stenosis.  6. There is mild dilatation of the aortic root, measuring 40 mm.  7. The inferior vena cava is normal in size with greater than 50%  respiratory variability, suggesting right atrial pressure of 3  mmHg.   Cath 2022 1. No angiographically significant coronary artery disease. 2. Normal left heart, right heart, and pulmonary artery pressures. 3. Normal Fick cardiac output/index.    Neuro/Psych  Headaches, PSYCHIATRIC DISORDERS Anxiety Dementia    GI/Hepatic negative GI ROS, Neg liver ROS,   Endo/Other  negative endocrine ROS  Renal/GU negative Renal ROS Bladder dysfunction      Musculoskeletal  (+) Arthritis , Osteoarthritis,    Abdominal   Peds  Hematology negative hematology ROS (+)   Anesthesia Other Findings   Reproductive/Obstetrics negative OB ROS                          Anesthesia Physical Anesthesia Plan  ASA: 3  Anesthesia Plan: General   Post-op Pain Management: Tylenol PO (pre-op)*   Induction: Intravenous  PONV Risk Score and Plan: 3 and Ondansetron, Dexamethasone and Treatment may vary due to age or medical condition  Airway Management Planned: LMA  Additional Equipment: None  Intra-op Plan:   Post-operative Plan: Extubation in OR  Informed Consent: I have reviewed the patients History and Physical, chart, labs and discussed the procedure including the risks, benefits and alternatives for the proposed anesthesia with the patient or authorized representative who has indicated his/her understanding and acceptance.     Dental advisory given  Plan Discussed with: CRNA  Anesthesia Plan Comments:       Anesthesia Quick Evaluation

## 2021-07-08 NOTE — H&P (Signed)
H&P  Chief Complaint: Difficulty urinating  History of Present Illness: 81 year old male years out from brachytherapy for prostate cancer, being treated now for recurrence.  The patient has a radiation-induced urethral stricture which is impassable via cystoscope.  He presents at this time for balloon dilation with Optilume.  Past Medical History:  Diagnosis Date   Bronchiectasis ([place])    Cardiomyopathy ([place])    Dyspnea    Dysrhythmia    Hyperlipidemia    Hypertension    NICM (nonischemic cardiomyopathy) ([place])    a. 09/2020 Echo: EF 45%, glob HK, GrI DD; b. 10/2020 Cath: Nl cors. Nl R heart filling pressures. Nl CO/CI; c. 10/2020 Echo: EF 35-40%, glob HK, mild asymm septal LVH, mod reduced RV fxn, mild-mod MR, mild AI, mild-mod AoV sclerosis.   Pneumonia    Prostate cancer [facility])    Thoracic aortic aneurysm ([place])    a. 09/2020 CTA Chest: 72m Asc TAA; b. 10/2020 s/p 313mHemashield graft w/ separate anastomoses to innominate and L common carotid; c. 10/2020 Echo: Mild Ao root dil @ 3969m   Past Surgical History:  Procedure Laterality Date   KNEE ARTHROSCOPY Left    2014   LOBECTOMY Left    Left lower lobectomy   mohs micrographic surgery nose     MOHS SURGERY Right 03/09/2021   Right ear   partial remove lung     2008   prostate surgery/transperinal placement of needles     RIGHT/LEFT HEART CATH AND CORONARY ANGIOGRAPHY N/A 10/20/2020   Procedure: RIGHT/LEFT HEART CATH AND CORONARY ANGIOGRAPHY;  Surgeon: EndNelva BushD;  [facility] [place] [facility];  Service: Cardiovascular;  Laterality: N/A;   TEE WITHOUT CARDIOVERSION N/A 11/03/2020   Procedure: TRANSESOPHAGEAL ECHOCARDIOGRAM (TEE);  Surgeon: HenMelrose NakayamaD;  [facility] [place];  Laterality: N/A;   THORACIC AORTIC ANEURYSM REPAIR N/A 11/03/2020   Procedure: THORACIC ASCENDING ANEURYSM REPAIR (AAA) USING HEMASHIELD PLATINUM 32MM GRAFT;  Surgeon: HenMelrose NakayamaD;  [facility] [place];  Laterality: N/A;  WITH CIRC ARREST    Home Medications:  Allergies as of 07/08/2021       Reactions   Amoxicillin Other (See Comments)   Bad taste in mouth & tongue gets fuzzy feeling   Statins    Oral problems and bad taste Fatigue        Medication List      Notice   Cannot display discharge medications because the patient has not yet been admitted.     Allergies:  Allergies  Allergen Reactions   Amoxicillin Other (See Comments)    Bad taste in mouth & tongue gets fuzzy feeling   Statins     Oral problems and bad taste Fatigue    Family History  Problem Relation Age of Onset   Parkinson's disease Mother    Cancer Father    Cancer - Prostate Father     Social History:  reports that he quit smoking about 60 years ago. His smoking use included cigarettes. He has never used smokeless tobacco. He reports that he does not currently use alcohol. He reports that he does not use drugs.  ROS: A complete review of systems was performed.  All systems are negative except for pertinent findings as noted.  Physical Exam:  Vital signs in last 24 hours: There were no vitals taken for this visit. Constitutional:  Alert and oriented, No acute distress Cardiovascular: Regular rate  Respiratory:  Normal respiratory effort GI: Abdomen is soft, nontender, nondistended, no abdominal masses. No CVAT.  Genitourinary: Normal male phallus, testes are descended bilaterally and non-tender and without masses, scrotum is normal in appearance without lesions or masses, perineum is normal on inspection. Lymphatic: No lymphadenopathy Neurologic: Grossly intact, no focal deficits Psychiatric: Normal mood and affect  I have reviewed prior pt notes    Impression/Assessment:  Postradiation bulbous urethral stricture  Plan:  Cystoscopy, Optilume balloon dilation of urethral stricture

## 2021-07-08 NOTE — Discharge Instructions (Signed)

## 2021-07-09 ENCOUNTER — Ambulatory Visit (HOSPITAL_BASED_OUTPATIENT_CLINIC_OR_DEPARTMENT_OTHER): Payer: Medicare Other | Admitting: Anesthesiology

## 2021-07-09 ENCOUNTER — Encounter (HOSPITAL_COMMUNITY): Payer: Self-pay | Admitting: Urology

## 2021-07-09 ENCOUNTER — Ambulatory Visit (HOSPITAL_COMMUNITY): Payer: Medicare Other | Admitting: Physician Assistant

## 2021-07-09 ENCOUNTER — Encounter (HOSPITAL_COMMUNITY)
Admission: RE | Disposition: A | Discharge status: Home / Self Care | Payer: Self-pay | Source: Home / Self Care | Attending: Urology

## 2021-07-09 ENCOUNTER — Ambulatory Visit (HOSPITAL_COMMUNITY): Payer: Medicare Other

## 2021-07-09 ENCOUNTER — Ambulatory Visit (HOSPITAL_COMMUNITY)
Admission: RE | Admit: 2021-07-09 | Discharge: 2021-07-09 | Disposition: A | Discharge status: Home / Self Care | Payer: Medicare Other | Attending: Urology | Admitting: Urology

## 2021-07-09 DIAGNOSIS — I5022 Chronic systolic (congestive) heart failure: Secondary | ICD-10-CM

## 2021-07-09 DIAGNOSIS — I110 Hypertensive heart disease with heart failure: Secondary | ICD-10-CM

## 2021-07-09 DIAGNOSIS — C61 Malignant neoplasm of prostate: Secondary | ICD-10-CM | POA: Diagnosis present

## 2021-07-09 DIAGNOSIS — Z87891 Personal history of nicotine dependence: Secondary | ICD-10-CM

## 2021-07-09 DIAGNOSIS — N35912 Unspecified bulbous urethral stricture, male: Secondary | ICD-10-CM | POA: Insufficient documentation

## 2021-07-09 DIAGNOSIS — N35812 Other urethral bulbous stricture, male: Secondary | ICD-10-CM

## 2021-07-09 DIAGNOSIS — J449 Chronic obstructive pulmonary disease, unspecified: Secondary | ICD-10-CM | POA: Diagnosis not present

## 2021-07-09 HISTORY — PX: CYSTOSCOPY WITH URETHRAL DILATATION: SHX5125

## 2021-07-09 SURGERY — CYSTOSCOPY, WITH URETHRAL DILATION
Anesthesia: General

## 2021-07-09 MED ORDER — EPHEDRINE SULFATE (PRESSORS) 50 MG/ML IJ SOLN
INTRAMUSCULAR | Status: DC | PRN
  Administered 2021-07-09: 5 mg via INTRAVENOUS

## 2021-07-09 MED ORDER — CHLORHEXIDINE GLUCONATE 0.12 % MT SOLN
15.0000 mL | Freq: Once | OROMUCOSAL | Status: AC
  Administered 2021-07-09: 15 mL via OROMUCOSAL

## 2021-07-09 MED ORDER — DEXAMETHASONE SODIUM PHOSPHATE 10 MG/ML IJ SOLN
INTRAMUSCULAR | Status: AC
  Filled 2021-07-09: qty 1

## 2021-07-09 MED ORDER — ORAL CARE MOUTH RINSE: 15.0000 mL | Freq: Once | OROMUCOSAL | Status: AC

## 2021-07-09 MED ORDER — ONDANSETRON HCL 4 MG/2ML IJ SOLN
INTRAMUSCULAR | Status: AC
  Filled 2021-07-09: qty 2

## 2021-07-09 MED ORDER — CEFAZOLIN SODIUM-DEXTROSE 2-4 GM/100ML-% IV SOLN
2.0000 g | INTRAVENOUS | Status: AC
  Administered 2021-07-09: 2 g via INTRAVENOUS
  Filled 2021-07-09: qty 100

## 2021-07-09 MED ORDER — ONDANSETRON HCL 4 MG/2ML IJ SOLN: 4.0000 mg | Freq: Once | INTRAMUSCULAR | Status: DC | PRN

## 2021-07-09 MED ORDER — ACETAMINOPHEN 500 MG PO TABS
1000.0000 mg | ORAL_TABLET | Freq: Once | ORAL | Status: AC
  Administered 2021-07-09: 1000 mg via ORAL
  Filled 2021-07-09: qty 2

## 2021-07-09 MED ORDER — PROPOFOL 10 MG/ML IV BOLUS
INTRAVENOUS | Status: DC | PRN
  Administered 2021-07-09: 100 mg via INTRAVENOUS

## 2021-07-09 MED ORDER — STERILE WATER FOR IRRIGATION IR SOLN
Status: DC | PRN
  Administered 2021-07-09: 3000 mL

## 2021-07-09 MED ORDER — IOHEXOL 300 MG/ML  SOLN
INTRAMUSCULAR | Status: DC | PRN
  Administered 2021-07-09: 20 mL

## 2021-07-09 MED ORDER — LIDOCAINE HCL (PF) 2 % IJ SOLN
INTRAMUSCULAR | Status: AC
  Filled 2021-07-09: qty 5

## 2021-07-09 MED ORDER — LIDOCAINE HCL (CARDIAC) PF 100 MG/5ML IV SOSY
PREFILLED_SYRINGE | INTRAVENOUS | Status: DC | PRN
  Administered 2021-07-09: 60 mg via INTRAVENOUS

## 2021-07-09 MED ORDER — FENTANYL CITRATE PF 50 MCG/ML IJ SOSY: 25.0000 ug | PREFILLED_SYRINGE | INTRAMUSCULAR | Status: DC | PRN

## 2021-07-09 MED ORDER — LACTATED RINGERS IV SOLN: INTRAVENOUS | Status: DC

## 2021-07-09 MED ORDER — PROPOFOL 10 MG/ML IV BOLUS
INTRAVENOUS | Status: AC
  Filled 2021-07-09: qty 20

## 2021-07-09 MED ORDER — DEXAMETHASONE SODIUM PHOSPHATE 4 MG/ML IJ SOLN
INTRAMUSCULAR | Status: DC | PRN
  Administered 2021-07-09: 10 mg via INTRAVENOUS

## 2021-07-09 SURGICAL SUPPLY — 20 items
BAG DRN RND TRDRP ANRFLXCHMBR (UROLOGICAL SUPPLIES) ×1
BAG URINE DRAIN 2000ML AR STRL (UROLOGICAL SUPPLIES) ×1 IMPLANT
BAG URO CATCHER STRL LF (MISCELLANEOUS) ×2 IMPLANT
BALLN OPTILUME DCB 30X5X75 (BALLOONS) ×2
BALLOON OPTILUME DCB 30X5X75 (BALLOONS) IMPLANT
CATH FOLEY 2WAY SLVR  5CC 16FR (CATHETERS) ×2
CATH FOLEY 2WAY SLVR 5CC 16FR (CATHETERS) IMPLANT
CATH URET 5FR 28IN CONE TIP (BALLOONS)
CATH URET 5FR 70CM CONE TIP (BALLOONS) IMPLANT
CATH URET WHISTLE 5FR 28IN (CATHETERS) IMPLANT
CLOTH BEACON ORANGE TIMEOUT ST (SAFETY) ×1 IMPLANT
GLOVE SURG LX 8.0 MICRO (GLOVE) ×1
GLOVE SURG LX STRL 8.0 MICRO (GLOVE) ×1 IMPLANT
GOWN STRL REUS W/ TWL XL LVL3 (GOWN DISPOSABLE) ×1 IMPLANT
GOWN STRL REUS W/TWL XL LVL3 (GOWN DISPOSABLE) ×2
GUIDEWIRE STR DUAL SENSOR (WIRE) ×1 IMPLANT
MANIFOLD NEPTUNE II (INSTRUMENTS) ×2 IMPLANT
PACK CYSTO (CUSTOM PROCEDURE TRAY) ×2 IMPLANT
PENCIL SMOKE EVACUATOR (MISCELLANEOUS) IMPLANT
TUBING CONNECTING 10 (TUBING) ×1 IMPLANT

## 2021-07-09 NOTE — Interval H&P Note (Signed)
History and Physical Interval Note:  07/09/2021 11:54 AM  Justin Contreras  has presented today for surgery, with the diagnosis of [place].  The various methods of treatment have been discussed with the patient and family. After consideration of risks, benefits and other options for treatment, the patient has consented to  Procedure(s) with comments: [place] (N/A) - 30 MINS as a surgical intervention.  The patient's history has been reviewed, patient examined, no change in status, stable for surgery.  I have reviewed the patient's chart and labs.  Questions were answered to the patient's satisfaction.     Lillette Boxer Tiffeny Minchew

## 2021-07-09 NOTE — Op Note (Signed)
Preoperative diagnosis: Bulbous urethral stricture, history of prostate cancer/radiotherapy  Postoperative diagnosis: Same  Procedure: Cystoscopy, balloon dilation of urethral stricture using Optilume, fluoroscopic use for less than 1 hour  Surgeon: Shadoe Bethel  Anesthesia: General with LMA  Complications: None  Estimated blood loss: None  Indications: 81 year old male with prostate cancer.  He underwent radiotherapy approximately 20 years ago.  He does have PSA recurrence which is being treated with androgen deprivation therapy.  He has recently began having significant lower urinary tract symptoms and in addition increasing residual urine volumes.  Recent cystoscopy revealed a very dense urethral stricture in the bulbous urethra.  As he has significant symptoms which may be worsening with the stricture, I have recommended treatment of the stricture.  We talked about the Optilume, which is a balloon impregnated with chemotherapy.  Long-term success rate with this is significantly better than incision or dilation alone.  He understands the procedure, risk, complications as well as expected outcomes and desires to proceed.  Findings: There was a dense white stricture at the bulbous urethra.  This would not pass the 21 French cystoscope.  Once balloon dilation was completed, the scope was easily advanced through this stricture.  Prostatic urethra was wide open.  Bladder displayed moderate trabeculations.  There were no urothelial abnormalities.  Ureteral orifices were normal in the location and configuration.  Description of procedure: The patient was properly identified in the holding area.  He was taken the operating room where general anesthetic was administered with the LMA.  He is placed in the dorsolithotomy position.  Genitalia and perineum were prepped, draped, proper timeout performed.  21 French pan endoscope advanced under direct vision up to the point of the stricture.  Urethra distal  to the stricture was normal.  I negotiated a sensor tip guidewire easily passed the stricture and into the bladder using fluoroscopic guidance.  The loop was seen there.  I then used a 30 [place], 50 mm Optilume balloon.  This was advanced over the guidewire past the stricture, straddling the stricture on each side.  I verified position both fluoroscopically and cystoscopically.  The balloon was then filled with water to a pressure of 12 atm for 10 minutes.  At this point the balloon was deflated and removed as well as the sensor tip guidewire.  The scope was replaced in the urethra.  Cystoscopy was easily performed at that time.  The scope easily passed through the stricture and into the bladder which was inspected.  At this point the procedure was terminated.  The bladder was drained, scope removed.  72 French Foley catheter placed, balloon filled with 10 cc of water and this was hooked to dependent drainage.  The patient was then awakened, taken to the PACU in stable condition, having tolerated the procedure well.

## 2021-07-09 NOTE — Transfer of Care (Signed)
Immediate Anesthesia Transfer of Care Note  Patient: Justin Contreras  Procedure(s) Performed: CYSTOSCOPY WITH OPTILUME DILATATION OF BLADDER NECK CONTRACTURE  Patient Location: PACU  Anesthesia Type:General  Level of Consciousness: awake  Airway & Oxygen Therapy: Patient Spontanous Breathing  Post-op Assessment: Report given to RN  Post vital signs: stable  Last Vitals:  Vitals Value Taken Time  BP 152/75 07/09/21 1250  Temp    Pulse 61 07/09/21 1254  Resp 11 07/09/21 1254  SpO2 100 % 07/09/21 1254  Vitals shown include unvalidated device data.  Last Pain:  Vitals:   07/09/21 1100  TempSrc: Oral  PainSc: 0-No pain         Complications: No notable events documented.

## 2021-07-09 NOTE — Anesthesia Postprocedure Evaluation (Signed)
Anesthesia Post Note  Patient: Justin Contreras  Procedure(s) Performed: CYSTOSCOPY WITH OPTILUME DILATATION OF BLADDER NECK CONTRACTURE     Patient location during evaluation: PACU Anesthesia Type: General Level of consciousness: awake and alert, oriented and patient cooperative Pain management: pain level controlled Vital Signs Assessment: post-procedure vital signs reviewed and stable Respiratory status: spontaneous breathing, nonlabored ventilation and respiratory function stable Cardiovascular status: blood pressure returned to baseline and stable Postop Assessment: no apparent nausea or vomiting Anesthetic complications: no   No notable events documented.  Last Vitals:  Vitals:   07/09/21 1300 07/09/21 1315  BP: (!) 161/65 (!) 157/73  Pulse: (!) 59 62  Resp: 12 17  Temp:    SpO2: 100% 99%    Last Pain:  Vitals:   07/09/21 1315  TempSrc:   PainSc: [name]

## 2021-07-09 NOTE — Anesthesia Procedure Notes (Signed)
Procedure Name: LMA Insertion Date/Time: 07/09/2021 12:10 PM  Performed by: Laylynn Campanella, Forest Gleason, CRNAPre-anesthesia Checklist: Patient identified, Emergency Drugs available, Suction available, Patient being monitored and Timeout performed Patient Re-evaluated:Patient Re-evaluated prior to induction Oxygen Delivery Method: Circle system utilized Preoxygenation: Pre-oxygenation with 100% oxygen Induction Type: IV induction Ventilation: Mask ventilation without difficulty LMA: LMA with gastric port inserted LMA Size: 5.0 Number of attempts: 1 Dental Injury: Teeth and Oropharynx as per pre-operative assessment

## 2021-07-10 ENCOUNTER — Encounter (HOSPITAL_COMMUNITY): Payer: Self-pay | Admitting: Urology

## 2021-09-06 ENCOUNTER — Other Ambulatory Visit: Payer: Self-pay | Admitting: Nurse Practitioner

## 2021-09-06 ENCOUNTER — Other Ambulatory Visit: Payer: Self-pay | Admitting: Internal Medicine

## 2021-09-07 ENCOUNTER — Other Ambulatory Visit: Payer: Self-pay | Admitting: Internal Medicine

## 2021-09-10 ENCOUNTER — Encounter: Payer: Self-pay | Admitting: Internal Medicine

## 2021-09-10 ENCOUNTER — Ambulatory Visit: Payer: Medicare Other | Admitting: Internal Medicine

## 2021-09-10 ENCOUNTER — Ambulatory Visit: Payer: Medicare Other | Attending: Internal Medicine | Admitting: Internal Medicine

## 2021-09-10 VITALS — BP 132/62 | HR 57 | Ht 74.0 in | Wt 167.5 lb

## 2021-09-10 DIAGNOSIS — I5022 Chronic systolic (congestive) heart failure: Secondary | ICD-10-CM | POA: Insufficient documentation

## 2021-09-10 DIAGNOSIS — I10 Essential (primary) hypertension: Secondary | ICD-10-CM | POA: Insufficient documentation

## 2021-09-10 DIAGNOSIS — E782 Mixed hyperlipidemia: Secondary | ICD-10-CM | POA: Insufficient documentation

## 2021-09-10 DIAGNOSIS — I7121 Aneurysm of the ascending aorta, without rupture: Secondary | ICD-10-CM | POA: Diagnosis not present

## 2021-09-10 DIAGNOSIS — I428 Other cardiomyopathies: Secondary | ICD-10-CM | POA: Diagnosis not present

## 2021-09-10 DIAGNOSIS — I480 Paroxysmal atrial fibrillation: Secondary | ICD-10-CM

## 2021-09-10 DIAGNOSIS — Z79899 Other long term (current) drug therapy: Secondary | ICD-10-CM | POA: Insufficient documentation

## 2021-09-10 MED ORDER — LOSARTAN POTASSIUM 100 MG PO TABS: 100.0000 mg | ORAL_TABLET | Freq: Every day | ORAL | 0 refills | Status: DC

## 2021-09-10 NOTE — Patient Instructions (Signed)
Medication Instructions:   Your physician has recommended you make the following change in your medication:   INCREASE Losartan 100 mg daily   *If you need a refill on your cardiac medications before your next appointment, please call your pharmacy*   Lab Work:  IN 2 WEEKS (~09/24/21) Please have FASTING labs done at the [place] in [facility].   - Bring orders with you that were given today in the office - You do not need to make an appointment - Check in at the registration desk   Testing/Procedures:  None ordered   Follow-Up: At [facility], you and your health needs are our priority.  As part of our continuing mission to provide you with exceptional heart care, we have created designated Provider Care Teams.  These Care Teams include your primary Cardiologist (physician) and Advanced Practice Providers (APPs -  Physician Assistants and Nurse Practitioners) who all work together to provide you with the care you need, when you need it.  We recommend signing up for the patient portal called "MyChart".  Sign up information is provided on this After Visit Summary.  MyChart is used to connect with patients for Virtual Visits (Telemedicine).  Patients are able to view lab/test results, encounter notes, upcoming appointments, etc.  Non-urgent messages can be sent to your provider as well.   To learn more about what you can do with MyChart, go to [email or web address].    Your next appointment:   3 month(s)  The format for your next appointment:   In Person  Provider:   You may see Nelva Bush, MD or one of the following Advanced Practice Providers on your designated Care Team:   Murray Hodgkins, NP Christell Faith, PA-C Cadence Kathlen Mody, PA-C Gerrie Nordmann, NP    Important Information About Sugar

## 2021-09-10 NOTE — Progress Notes (Signed)
Follow-up Outpatient Visit Date: 09/10/2021  Primary Care Provider: Derrill Center., MD [address] [place] [address]  Chief Complaint: Follow-up thoracic aortic aneurysm and HFrEF  HPI:  Justin Contreras is a 81 y.o. 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  I last saw him in late May, at which time he was feeling fairly well.  He was mostly concerned about urinary frequency.  He had just been started on donepezil for recently diagnosed Alzheimer's disease and has since also been placed on memantine.  He noted sporadic "twinges" of chest pain but no exertional chest discomfort or dyspnea.  We agreed to add ezetimibe and increase losartan to 50 mg daily for improved lipid and blood pressure control.  Follow-up echocardiogram in June showed LVEF of 35-40% as well as mild mitral and aortic regurgitation.  Overall, findings were similar to prior echo in 10/2020.  Today, Justin Contreras reports he is feeling fairly well from a heart standpoint, denying chest pain, shortness of breath, palpitations, lightheadedness, and edema.  He remains most bothered by his urologic issues including difficulty completely emptying his bladder as well as side effects from androgen suppression for treatment of his prostate cancer.  He is scheduled to follow-up with Dr. Roxan Hockey in December.  --------------------------------------------------------------------------------------------------  Past Medical History:  Diagnosis Date   Bronchiectasis ([place])    Cardiomyopathy ([place])    Dyspnea    Dysrhythmia    Hyperlipidemia    Hypertension    NICM (nonischemic cardiomyopathy) ([place])    a. 09/2020 Echo: EF 45%, glob HK, GrI DD; b. 10/2020 Cath: Nl cors. Nl R  heart filling pressures. Nl CO/CI; c. 10/2020 Echo: EF 35-40%, glob HK, mild asymm septal LVH, mod reduced RV fxn, mild-mod MR, mild AI, mild-mod AoV sclerosis.   Pneumonia    Prostate cancer [facility])    Thoracic aortic aneurysm ([place])    a. 09/2020 CTA Chest: 36m Asc TAA; b. 10/2020 s/p 334mHemashield graft w/ separate anastomoses to innominate and L common carotid; c. 10/2020 Echo: Mild Ao root dil @ 3949m  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;  Surgeon: DahFranchot GalloD;  Location: [facility];  Service: Urology;  Laterality: N/A;  30 MINS   KNEE ARTHROSCOPY Left    2014   LOBECTOMY Left    Left lower lobectomy   mohs micrographic surgery nose     MOHS SURGERY Right 03/09/2021   Right ear   partial remove lung     2008   prostate surgery/transperinal placement of needles     RIGHT/LEFT HEART CATH AND CORONARY ANGIOGRAPHY N/A 10/20/2020   Procedure: RIGHT/LEFT HEART CATH AND CORONARY ANGIOGRAPHY;  Surgeon: EndNelva BushD;  Location: [facility];  Service: Cardiovascular;  Laterality: N/A;   TEE WITHOUT CARDIOVERSION N/A 11/03/2020   Procedure: TRANSESOPHAGEAL ECHOCARDIOGRAM (TEE);  Surgeon: HenMelrose NakayamaD;  [facility] [place];  Laterality: N/A;   THORACIC AORTIC ANEURYSM REPAIR N/A 11/03/2020   Procedure: THORACIC ASCENDING ANEURYSM REPAIR (AAA) USING HEMASHIELD PLATINUM 32MM GRAFT;  Surgeon: HenMelrose NakayamaD;  [facility] [place];  Laterality: N/A;  WITH CIRC ARREST  Current Meds  Medication Sig   aspirin EC 81 MG tablet Take 1 tablet (81 mg total) by mouth daily. Swallow whole.   B Complex Vitamins (B COMPLEX PO) Place 1 mL under the tongue daily.   carvedilol (COREG) 6.25 MG tablet TAKE 1 TABLET BY MOUTH TWICE A DAY   donepezil (ARICEPT) 10 MG tablet  Take 10 mg by mouth daily.   ezetimibe (ZETIA) 10 MG tablet TAKE 1 TABLET BY MOUTH EVERY DAY   losartan (COZAAR) 50 MG tablet TAKE 1 TABLET BY MOUTH EVERY DAY   memantine (NAMENDA) 5 MG tablet Take 5 mg by mouth daily.   Pediatric Multiple Vitamins (FLINTSTONES MULTIVITAMIN PO) Take 1 tablet by mouth daily.   Polyethyl Glycol-Propyl Glycol (SYSTANE OP) Place 1 drop into both eyes daily as needed (dry eyes).   silodosin (RAPAFLO) 8 MG CAPS capsule Take 8 mg by mouth at bedtime.    Allergies: Amoxicillin and Statins  Social History   Tobacco Use   Smoking status: Former    Years: 10.00    Types: Cigarettes    Quit date: 1963    Years since quitting: 60.7   Smokeless tobacco: Never  Vaping Use   Vaping Use: Never used  Substance Use Topics   Alcohol use: Not Currently   Drug use: Never    Family History  Problem Relation Age of Onset   Parkinson's disease Mother    Cancer Father    Cancer - Prostate Father     Review of Systems: A 12-system review of systems was performed and was negative except as noted in the HPI.  --------------------------------------------------------------------------------------------------  Physical Exam: BP 132/62 (BP Location: Left Arm, Patient Position: Sitting, Cuff Size: Normal)   Pulse (!) 57   Ht '6\' 2"'$  (1.88 m)   Wt 167 lb 8 oz (76 kg)   SpO2 98%   BMI 21.51 kg/m   General:  NAD.  Accompanied by his wife. Neck: No JVD or HJR. Lungs: Clear to auscultation bilaterally without wheezes or crackles. Heart: Bradycardia cardiac but regular with 1/6 systolic murmur.  No rubs or gallops. Abdomen: Soft, nontender, nondistended. Extremities: No lower extremity edema.  EKG: Sinus bradycardia with left bundle branch block.  Compared to prior tracing from 06/10/2021, LBBB is now present.  Lab Results  Component Value Date   WBC 8.7 11/08/2020   HGB 9.0 (L) 11/08/2020   HCT 26.2 (L) 11/08/2020   MCV 92.3 11/08/2020   PLT 154 11/08/2020     Lab Results  Component Value Date   NA 141 06/26/2021   K 3.9 06/26/2021   CL 105 06/26/2021   CO2 32 06/26/2021   BUN 22 06/26/2021   CREATININE 0.62 06/26/2021   GLUCOSE 85 06/26/2021   ALT 25 10/30/2020    Lab Results  Component Value Date   CHOL 163 10/20/2020   HDL 58 10/20/2020   LDLCALC 96 10/20/2020   TRIG 44 10/20/2020   CHOLHDL 2.8 10/20/2020    --------------------------------------------------------------------------------------------------  ASSESSMENT AND PLAN: Chronic HFrEF due to nonischemic cardiomyopathy: Justin Contreras denies heart failure symptoms and appears euvolemic on examination today.  EKG shows new LBBB.  Blood pressure today is borderline elevated.  We will use this opportunity to escalate his goal-directed medical therapy by increasing losartan to 100 mg daily.  I am reluctant to titrate up his carvedilol given resting heart rate in the upper 50s and underlying conduction disease.  I will also defer challenging him with an SGLT2 inhibitor given his  ongoing urologic issues.  Thoracic aortic aneurysm and mixed hyperlipidemia: Mr. Slimp has recovered well from his aneurysm repair with Dr. Roxan Hockey and is scheduled for routine follow-up with him in December.  Continue secondary prevention of vascular disease with aspirin and ezetimibe in the lieu of statins given prior statin intolerance.  We will plan to check a CMP and fasting lipid panel in about 2 weeks at our [facility] location.  Hypertension: Blood pressure borderline elevated today.  As above, we will increase losartan to 100 mg daily and plan for CMP in about 2 weeks.  Follow-up: Return to clinic in 3 months; EKG should be repeated at that time given new LBBB at this visit.  Nelva Bush, MD 09/10/2021 4:31 PM

## 2021-09-11 ENCOUNTER — Encounter: Payer: Self-pay | Admitting: Internal Medicine

## 2021-09-11 DIAGNOSIS — I7121 Aneurysm of the ascending aorta, without rupture: Secondary | ICD-10-CM | POA: Insufficient documentation

## 2021-09-17 ENCOUNTER — Other Ambulatory Visit: Payer: Self-pay | Admitting: Thoracic Surgery (Cardiothoracic Vascular Surgery)

## 2021-09-17 DIAGNOSIS — I7121 Aneurysm of the ascending aorta, without rupture: Secondary | ICD-10-CM

## 2021-09-22 ENCOUNTER — Telehealth: Payer: Self-pay | Admitting: Internal Medicine

## 2021-09-22 NOTE — Telephone Encounter (Signed)
The lab at [facility] called, stating they received lab orders.  However, they only service the emergency room.  It was faxed to the wrong lab at [facility] she wanted to make [place] aware.

## 2021-09-22 NOTE — Telephone Encounter (Signed)
Patient was given printed orders at office visit.

## 2021-10-27 ENCOUNTER — Ambulatory Visit (INDEPENDENT_AMBULATORY_CARE_PROVIDER_SITE_OTHER): Payer: Medicare Other | Admitting: Thoracic Surgery (Cardiothoracic Vascular Surgery)

## 2021-10-27 ENCOUNTER — Ambulatory Visit
Admission: RE | Admit: 2021-10-27 | Discharge: 2021-10-27 | Disposition: A | Discharge status: Home / Self Care | Payer: Medicare Other | Source: Ambulatory Visit | Attending: Thoracic Surgery (Cardiothoracic Vascular Surgery) | Admitting: Thoracic Surgery (Cardiothoracic Vascular Surgery)

## 2021-10-27 ENCOUNTER — Encounter: Payer: Self-pay | Admitting: Thoracic Surgery (Cardiothoracic Vascular Surgery)

## 2021-10-27 VITALS — BP 118/60 | HR 86 | Resp 20 | Ht 74.0 in | Wt 165.0 lb

## 2021-10-27 DIAGNOSIS — Z9889 Other specified postprocedural states: Secondary | ICD-10-CM

## 2021-10-27 DIAGNOSIS — I7121 Aneurysm of the ascending aorta, without rupture: Secondary | ICD-10-CM

## 2021-10-27 DIAGNOSIS — Z8679 Personal history of other diseases of the circulatory system: Secondary | ICD-10-CM

## 2021-10-27 MED ORDER — IOPAMIDOL (ISOVUE-370) INJECTION 76%
75.0000 mL | Freq: Once | INTRAVENOUS | Status: AC | PRN
Start: 2021-10-27 — End: 2021-10-27
  Administered 2021-10-27: 75 mL via INTRAVENOUS

## 2021-10-27 NOTE — Progress Notes (Signed)
Justin Contreras       Justin Contreras,Justin Contreras [id number]             [phone]     HPI: Justin Contreras returns for a scheduled follow-up visit  Justin Contreras is an Justin Contreras year old male with a history of prostate cancer, hypertension hyperlipidemia, ascending and arch aortic aneurysm, and Alzheimer's.  He was being evaluated for recurrent prostate cancer.  PET/CT showed an ascending and proximal arch aneurysm.  He underwent replacement of his ascending aorta and arch under moderate hypothermic circulatory arrest on 11/03/2020.  He had postoperative atrial fibrillation but converted to sinus rhythm with amiodarone.  He was having memory issues prior to surgery.  Surgery did exacerbate that to some extent.  He has since been diagnosed with Alzheimer's.  He is still able to drive and work.  He is not having any problems related to his incision.  No chest pain, pressure, or tightness.  Past Medical History:  Diagnosis Date   Bronchiectasis ([place])    Cardiomyopathy ([place])    Dyspnea    Dysrhythmia    Hyperlipidemia    Hypertension    NICM (nonischemic cardiomyopathy) ([place])    a. 09/2020 Echo: EF 45%, glob HK, GrI DD; b. 10/2020 Cath: Nl cors. Nl R heart filling pressures. Nl CO/CI; c. 10/2020 Echo: EF 35-40%, glob HK, mild asymm septal LVH, mod reduced RV fxn, mild-mod MR, mild AI, mild-mod AoV sclerosis.   Pneumonia    Prostate cancer [facility])    Thoracic aortic aneurysm ([place])    a. 09/2020 CTA Chest: 58m Asc TAA; b. 10/2020 s/p 328mHemashield graft w/ separate anastomoses to innominate and L common carotid; c. 10/2020 Echo: Mild Ao root dil @ 3979m   Current Outpatient Medications  Medication Sig Dispense Refill   aspirin EC 81 MG tablet Take 1 tablet (81 mg total) by mouth daily. Swallow whole. 90 tablet 0   B Complex Vitamins (B COMPLEX PO) Place 1 mL under the tongue daily.     carvedilol (COREG) 6.25 MG tablet TAKE 1 TABLET BY MOUTH TWICE A DAY 180 tablet 0   donepezil (ARICEPT) 10 MG  tablet Take 10 mg by mouth daily.     ezetimibe (ZETIA) 10 MG tablet TAKE 1 TABLET BY MOUTH EVERY DAY 90 tablet 0   losartan (COZAAR) 100 MG tablet Take 1 tablet (100 mg total) by mouth daily. 90 tablet 0   memantine (NAMENDA) 5 MG tablet Take 5 mg by mouth daily.     Pediatric Multiple Vitamins (FLINTSTONES MULTIVITAMIN PO) Take 1 tablet by mouth daily.     Polyethyl Glycol-Propyl Glycol (SYSTANE OP) Place 1 drop into both eyes daily as needed (dry eyes).     silodosin (RAPAFLO) 8 MG CAPS capsule Take 8 mg by mouth at bedtime.     No current facility-administered medications for this visit.    Physical Exam BP 118/60   Pulse 86   Resp 20   Ht '6\' 2"'$  (1.88 m)   Wt 165 lb (74.8 kg)   SpO2 95% Comment: RA  BMI 21.18 49/m  Justin Contreras Justin Contreras in no acute distress Alert and oriented x3 with no focal deficits Lungs clear with equal breath sounds bilaterally Cardiac regular rate and rhythm with no murmur or rub Sternum stable No peripheral edema  Diagnostic Tests: CT ANGIOGRAPHY CHEST WITH CONTRAST  TECHNIQUE: Multidetector CT imaging of the chest was performed using the standard protocol during bolus administration of intravenous contrast.  Multiplanar CT image reconstructions and MIPs were obtained to evaluate the vascular anatomy.  RADIATION DOSE REDUCTION: This exam was performed according to the departmental dose-optimization program which includes automated exposure control, adjustment of the mA and/or kV according to patient size and/or use of iterative reconstruction technique.  CONTRAST: 13m ISOVUE-370 IOPAMIDOL (ISOVUE-370) INJECTION 76%  COMPARISON: September 19, 2020.  FINDINGS: Cardiovascular:  Caliber of the aortic sinus at 4.1 cm. Caliber of the ascending thoracic aorta following repair at 3.3 cm. Mild irregularity delineating the extent of the graft along the ascending thoracic aorta. No acute aortic process.  Aortic atherosclerosis both calcified and  noncalcified. No aneurysmal dilation of the descending thoracic aorta. No pericardial effusion. Heart size is stable and mild to moderately enlarged.  Central pulmonary vasculature not well evaluated. Normal caliber of central pulmonary vessels following partial lung resection in the LEFT chest and prior LEFT lower lobectomy.  Mediastinum/Nodes: Thoracic inlet structures are normal. No axillary lymphadenopathy. No mediastinal or hilar lymphadenopathy.  Lungs/Pleura: Post LEFT lower lobectomy. Calcified pleural plaques without change throughout the RIGHT chest. No consolidation. No pleural effusion. Airways are patent.  Upper Abdomen: Incidental imaging of upper abdominal contents shows no acute process. 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  Musculoskeletal: Post median sternotomy for aortic aneurysm repair. No acute or destructive bone process.  Review of the MIP images confirms the above findings.  IMPRESSION: 1. Caliber of the ascending thoracic aorta following repair at 3.3 cm. Mild irregularity delineating the extent of the graft along the ascending thoracic aorta. No acute aortic process. 2. 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 3. Post LEFT lower lobectomy. 4. Calcified pleural plaques without change throughout the RIGHT chest. Correlate with any history of asbestos exposure. 5. Aortic atherosclerosis.  Aortic Atherosclerosis (ICD10-I70.0).   Electronically Signed By: GZetta BillsM.D.  I personally reviewed the CT images.  Postoperative changes from previous left lower lobectomy.  Status post repair of ascending and arch aneurysm with grafts to innominate and left common carotid.  Repair appears intact.  Impression: Justin Kubisiakis an 81year old male with a history of prostate cancer, hypertension hyperlipidemia, ascending and arch aortic aneurysm, and Alzheimer's.    Ascending and arch aneurysm-now 1  year out from repair.  No concerns based on CT angiogram.  We will plan to repeat a CT angiogram in about a year.  If no issues then should not need any further follow-up.  Memory loss-diagnosed with Alzheimer's.  Symptoms currently stable.  Hypertension-blood pressure well controlled  Plan:  Return in 1 year with CT angiogram of chest  SMelrose Nakayama MD [facility] ([phone]

## 2021-12-06 ENCOUNTER — Other Ambulatory Visit: Payer: Self-pay | Admitting: Internal Medicine

## 2021-12-06 ENCOUNTER — Other Ambulatory Visit: Payer: Self-pay | Admitting: Nurse Practitioner

## 2021-12-07 ENCOUNTER — Other Ambulatory Visit: Payer: Self-pay | Admitting: Internal Medicine

## 2021-12-10 IMAGING — DX DG CHEST 2V
2 series · 2 of 2 positions shown · non-contrast
Comparison: 11/06/2020; correlation CT chest 09/19/2020

CLINICAL DATA: Post aortic aneurysm repair ascending

EXAM:
CHEST - 2 VIEW

[dg chest 2 view (1 of 2)]
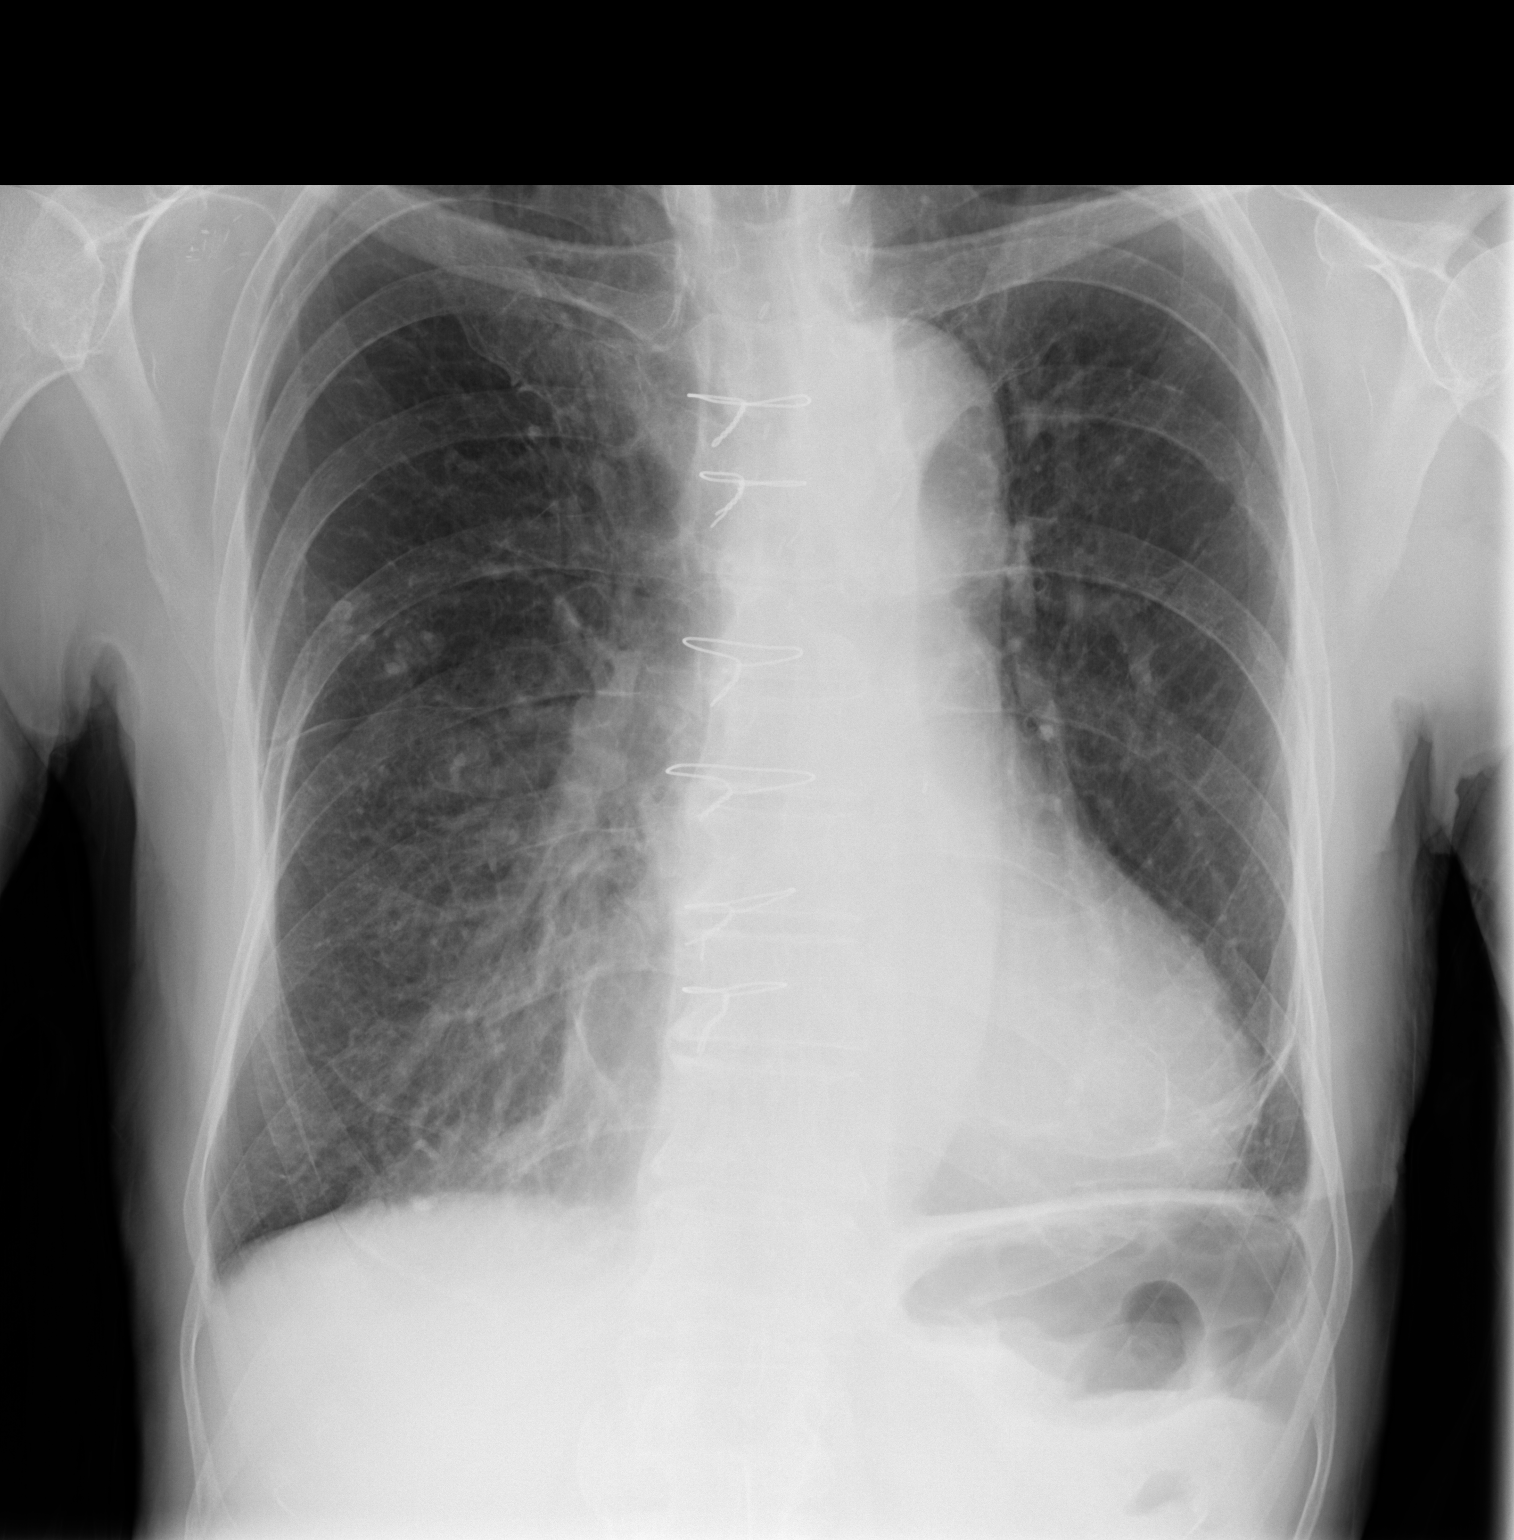

[dg chest 2 view (2 of 2)]
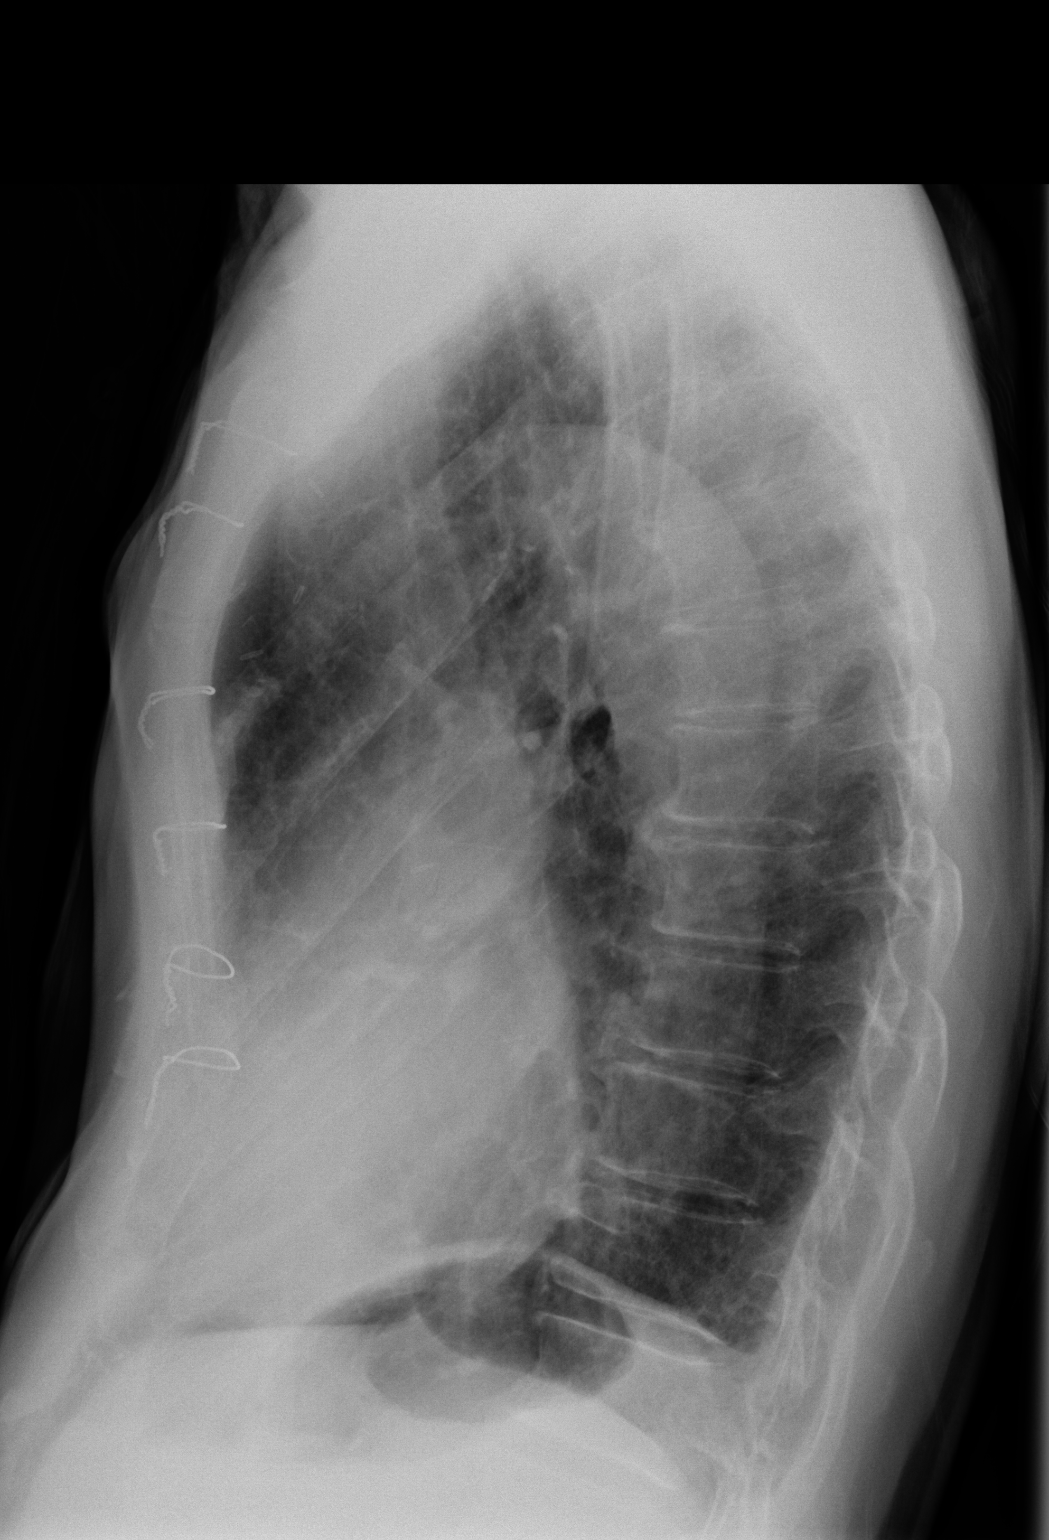

[2 of 2 positions shown; findings below may reference images not displayed]

FINDINGS: Upper normal size of cardiac silhouette.

Normal mediastinal contours and pulmonary vascularity.

Prior median sternotomy with mild atherosclerotic calcification
within aorta.

Calcifications project over mid RIGHT lung, corresponding to
calcified pleural plaques on prior CT.

Emphysematous changes with scarring at LEFT base.

No acute pulmonary infiltrate, pleural effusion, or pneumothorax.

Osseous structures unremarkable.
IMPRESSION: COPD changes with basilar scarring.

Calcified pleural plaques, by prior CT identified in the
hemithoraces bilaterally, question asbestos exposure.

Aortic Atherosclerosis (H9GUA-Q78.8) and Emphysema (H9GUA-X4D.T).

## 2021-12-16 ENCOUNTER — Encounter: Payer: Self-pay | Admitting: Internal Medicine

## 2021-12-16 ENCOUNTER — Ambulatory Visit: Payer: Medicare Other | Attending: Internal Medicine | Admitting: Internal Medicine

## 2021-12-16 VITALS — BP 136/56 | HR 67 | Ht 74.0 in | Wt 167.0 lb

## 2021-12-16 DIAGNOSIS — I428 Other cardiomyopathies: Secondary | ICD-10-CM | POA: Diagnosis not present

## 2021-12-16 DIAGNOSIS — I7121 Aneurysm of the ascending aorta, without rupture: Secondary | ICD-10-CM | POA: Diagnosis not present

## 2021-12-16 DIAGNOSIS — I10 Essential (primary) hypertension: Secondary | ICD-10-CM | POA: Diagnosis not present

## 2021-12-16 DIAGNOSIS — I5022 Chronic systolic (congestive) heart failure: Secondary | ICD-10-CM

## 2021-12-16 NOTE — Progress Notes (Unsigned)
Follow-up Outpatient Visit Date: 12/16/2021  Primary Care Provider: Derrill Center., MD [address] [place] [address]  Chief Complaint: Follow-up chronic HFrEF and thoracic aortic aneurysm  HPI:  Mr. Starliper is a 81 y.o. 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  I last saw him in August, at which time he was feeling well from a heart standpoint.  He remained concerned about urologic issues, including urinary retention and side-effects from his androgen suppression therapy.  Today, Mr. Shugart reports that he is feeling well from a heart standpoint.  He denies chest pain, shortness of breath, lightheadedness, and edema.  He sometimes feels like his heart is beating faster than it should, though he does not endorse frank palpitations.  He is troubled by increasing forgetfulness" trouble with my mind."  He recently established with the geriatrics clinic at [facility] and has been switched from [place] to Aricept.  He continues to be bothered by urinary/prostate issues.  --------------------------------------------------------------------------------------------------  Past Medical History:  Diagnosis Date   Bronchiectasis ([place])    Cardiomyopathy ([place])    Dyspnea    Dysrhythmia    Hyperlipidemia    Hypertension    NICM (nonischemic cardiomyopathy) ([place])    a. 09/2020 Echo: EF 45%, glob HK, GrI DD; b. 10/2020 Cath: Nl cors. Nl R heart filling pressures. Nl CO/CI; c. 10/2020 Echo: EF 35-40%, glob HK, mild asymm septal LVH, mod reduced RV fxn, mild-mod MR, mild AI, mild-mod AoV sclerosis.   Pneumonia    Prostate cancer [facility])    Thoracic aortic aneurysm ([place])    a. 09/2020 CTA Chest: 36m Asc TAA; b. 10/2020 s/p 386mHemashield graft w/ separate  anastomoses to innominate and L common carotid; c. 10/2020 Echo: Mild Ao root dil @ 3915m  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;  Surgeon: DahFranchot GalloD;  Location: [facility];  Service: Urology;  Laterality: N/A;  30 MINS   KNEE ARTHROSCOPY Left    2014   LOBECTOMY Left    Left lower lobectomy   mohs micrographic surgery nose     MOHS SURGERY Right 03/09/2021   Right ear   partial remove lung     2008   prostate surgery/transperinal placement of needles     RIGHT/LEFT HEART CATH AND CORONARY ANGIOGRAPHY N/A 10/20/2020   Procedure: RIGHT/LEFT HEART CATH AND CORONARY ANGIOGRAPHY;  Surgeon: EndNelva BushD;  Location: [facility] [place] [facility];  Service: Cardiovascular;  Laterality: N/A;   TEE WITHOUT CARDIOVERSION N/A 11/03/2020   Procedure: TRANSESOPHAGEAL ECHOCARDIOGRAM (TEE);  Surgeon: HenMelrose NakayamaD;  [facility] [place];  Laterality: N/A;   THORACIC AORTIC ANEURYSM REPAIR N/A 11/03/2020   Procedure: THORACIC ASCENDING ANEURYSM REPAIR (AAA) USING HEMASHIELD PLATINUM 32MM GRAFT;  Surgeon: HenMelrose NakayamaD;  [facility] [place];  Laterality: N/A;  WITH CIRC ARREST    Current Meds  Medication Sig   aspirin EC 81 MG tablet Take 1 tablet (81 mg total) by mouth daily. Swallow whole.   B Complex Vitamins (B COMPLEX PO) Place 1 mL under the tongue daily.   carvedilol (COREG) 6.25 MG tablet TAKE 1 TABLET BY MOUTH  TWICE A DAY   donepezil (ARICEPT) 10 MG tablet Take 10 mg by mouth daily.   ezetimibe (ZETIA) 10 MG tablet TAKE 1 TABLET BY MOUTH EVERY DAY   GEMTESA 75 MG TABS Take 1 tablet by mouth daily.   losartan (COZAAR) 100 MG tablet TAKE 1 TABLET BY MOUTH EVERY DAY   Pediatric Multiple Vitamins (FLINTSTONES MULTIVITAMIN PO) Take 1 tablet by mouth daily.   Polyethyl  Glycol-Propyl Glycol (SYSTANE OP) Place 1 drop into both eyes daily as needed (dry eyes).   silodosin (RAPAFLO) 8 MG CAPS capsule Take 8 mg by mouth at bedtime.    Allergies: Amoxicillin and Statins  Social History   Tobacco Use   Smoking status: Former    Years: 10.00    Types: Cigarettes    Quit date: 1963    Years since quitting: 60.9   Smokeless tobacco: Never  Vaping Use   Vaping Use: Never used  Substance Use Topics   Alcohol use: Yes    Comment: occasional glass of wine   Drug use: Never    Family History  Problem Relation Age of Onset   Parkinson's disease Mother    Cancer Father    Cancer - Prostate Father     Review of Systems: A 12-system review of systems was performed and was negative except as noted in the HPI.  --------------------------------------------------------------------------------------------------  Physical Exam: BP (!) 136/56 (BP Location: Left Arm, Patient Position: Sitting, Cuff Size: Normal)   Pulse 67   Ht '6\' 2"'$  (1.88 m)   Wt 167 lb (75.8 kg)   SpO2 97%   BMI 21.44 kg/m   General:  NAD. Neck: No JVD or HJR. Lungs: Clear to auscultation bilaterally without wheezes or crackles. Heart: Regular rate and rhythm with 1/6 systolic murmur. Abdomen: Soft, nontender, nondistended. Extremities: No lower extremity edema.  EKG: Normal sinus rhythm with left bundle branch block.  Heart rate has increased slightly since 09/10/2021.  Otherwise, no significant interval change.  Lab Results  Component Value Date   WBC 8.7 11/08/2020   HGB 9.0 (L) 11/08/2020   HCT 26.2 (L) 11/08/2020   MCV 92.3 11/08/2020   PLT 154 11/08/2020    Lab Results  Component Value Date   NA 141 06/26/2021   K 3.9 06/26/2021   CL 105 06/26/2021   CO2 32 06/26/2021   BUN 22 06/26/2021   CREATININE 0.62 06/26/2021   GLUCOSE 85 06/26/2021   ALT 25 10/30/2020    Lab Results  Component Value Date   CHOL 163 10/20/2020   HDL 58 10/20/2020   LDLCALC 96  10/20/2020   TRIG 44 10/20/2020   CHOLHDL 2.8 10/20/2020    --------------------------------------------------------------------------------------------------  ASSESSMENT AND PLAN: Chronic HFrEF due to nonischemic cardiomyopathy: Mr. Yearwood appears euvolemic with NYHA class I symptoms.  We discussed escalation of GDMT, including increasing dose of carvedilol and/or transitioning losartan to Entresto.  However, given resting heart rate in the upper 50s to 60s, I am reluctant to increase carvedilol further.  We will also defer switching to [facility] given NYHA class I symptoms and other comorbidities at this time.  I would be reluctant to add an SGLT2 inhibitor as well given his history of urinary/prostate problems.  We will plan to repeat an echocardiogram shortly before Mr. Magnus Sinning follow-up visit with me in 6 months to reassess his LVEF (previously 35-40%).  Of note, LBBB, which was new at his last visit, is unchanged today.  If there is evidence of decreased LVEF on  follow-up echo, referral to EP will need to be discussed.  Thoracic aortic aneurysm: Mr. Ratay has recovered well from his repair with Dr. Roxan Hockey last year.  Continue aspirin and ezetimibe given statin intolerance.  Continue surveillance with Dr. Roxan Hockey as previously recommended.  Hypertension: Blood pressure reasonable today.  Continue current doses of carvedilol and losartan.  Follow-up: Return to clinic in 6 months.  Nelva Bush, MD 12/16/2021 4:55 PM

## 2021-12-16 NOTE — Patient Instructions (Signed)
Medication Instructions:  Your Physician recommend you continue on your current medication as directed.    *If you need a refill on your cardiac medications before your next appointment, please call your pharmacy*   Lab Work: None ordered today   Testing/Procedures: Your physician has requested that you have an echocardiogram in 6 months. Echocardiography is a painless test that uses sound waves to create images of your heart. It provides your doctor with information about the size and shape of your heart and how well your heart's chambers and valves are working.   You may receive an ultrasound enhancing agent through an IV if needed to better visualize your heart during the echo. This procedure takes approximately one hour.  There are no restrictions for this procedure.  This will take place at [facility] [id number], [place]    Follow-Up: At [facility], you and your health needs are our priority.  As part of our continuing mission to provide you with exceptional heart care, we have created designated Provider Care Teams.  These Care Teams include your primary Cardiologist (physician) and Advanced Practice Providers (APPs -  Physician Assistants and Nurse Practitioners) who all work together to provide you with the care you need, when you need it.  We recommend signing up for the patient portal called "MyChart".  Sign up information is provided on this After Visit Summary.  MyChart is used to connect with patients for Virtual Visits (Telemedicine).  Patients are able to view lab/test results, encounter notes, upcoming appointments, etc.  Non-urgent messages can be sent to your provider as well.   To learn more about what you can do with MyChart, go to [email or web address].    Your next appointment:   6 month(s)  The format for your next appointment:   In Person  Provider:   You may see Nelva Bush, MD or one of the following  Advanced Practice Providers on your designated Care Team:   Murray Hodgkins, NP Christell Faith, PA-C Cadence Kathlen Mody, PA-C Gerrie Nordmann, NP

## 2021-12-17 ENCOUNTER — Encounter: Payer: Self-pay | Admitting: Internal Medicine

## 2021-12-22 ENCOUNTER — Telehealth: Payer: Self-pay | Admitting: Internal Medicine

## 2021-12-22 NOTE — Telephone Encounter (Signed)
-----   Message from Meryl Crutch, RN sent at 12/17/2021  8:25 AM EST ----- Please schedule pt for an ECHO in 6 months and follow up after.  Lars Mage, RN

## 2021-12-22 NOTE — Telephone Encounter (Signed)
LMOV to schedule ECHO and 6 month f/u

## 2022-01-03 ENCOUNTER — Other Ambulatory Visit: Payer: Self-pay | Admitting: Nurse Practitioner

## 2022-01-05 ENCOUNTER — Other Ambulatory Visit: Payer: Self-pay

## 2022-01-05 MED ORDER — CARVEDILOL 6.25 MG PO TABS: 6.2500 mg | ORAL_TABLET | Freq: Two times a day (BID) | ORAL | 3 refills | Status: DC

## 2022-01-11 DIAGNOSIS — I351 Nonrheumatic aortic (valve) insufficiency: Secondary | ICD-10-CM

## 2022-01-11 HISTORY — DX: Nonrheumatic aortic (valve) insufficiency: I35.1

## 2022-02-04 IMAGING — CR DG CHEST 2V
2 series · 2 of 2 positions shown · non-contrast
Comparison: Chest x-ray 12/02/2020

CLINICAL DATA: History of ascending thoracic aorta aneurysm repair.

EXAM:
CHEST - 2 VIEW

[w chest pa]
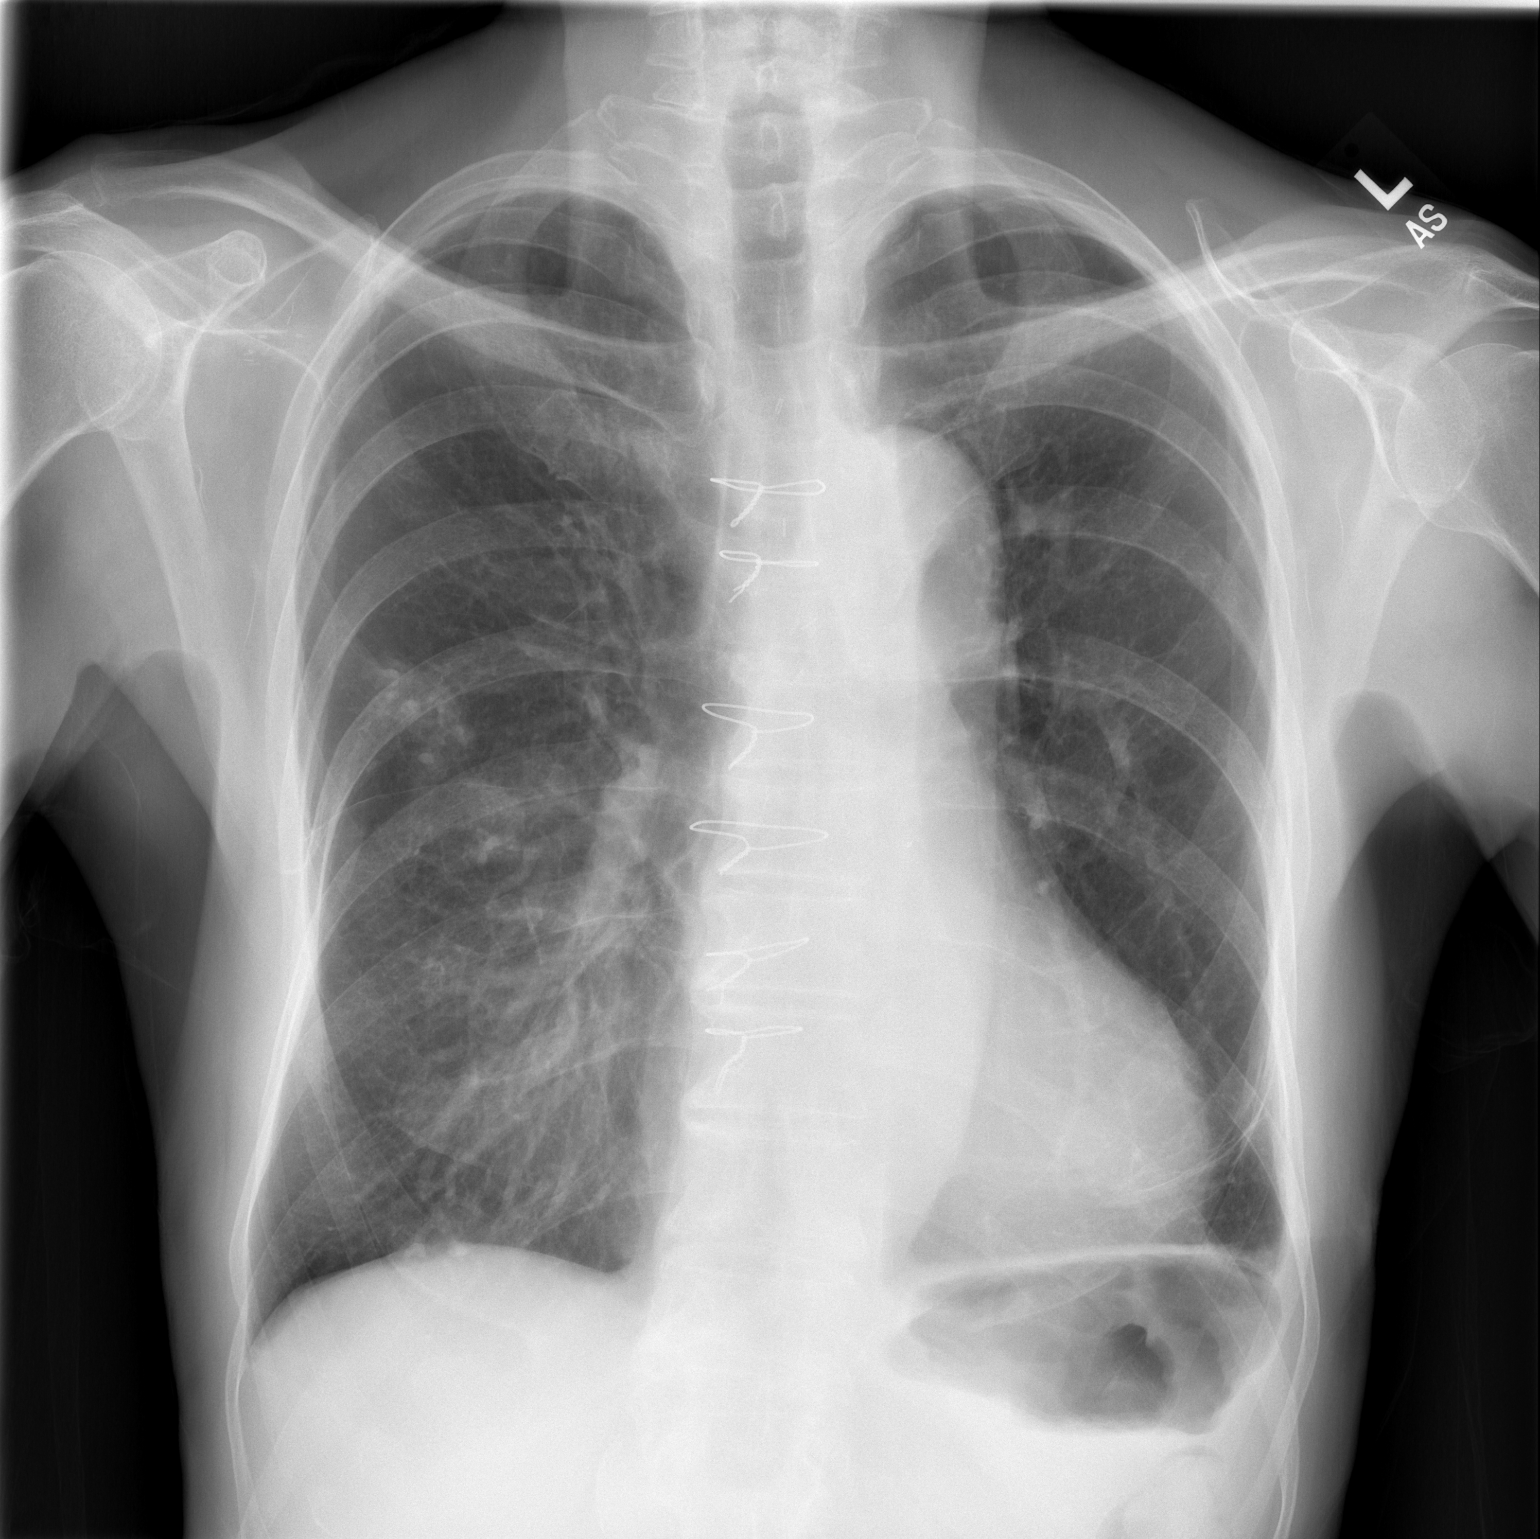

[w chest lat]
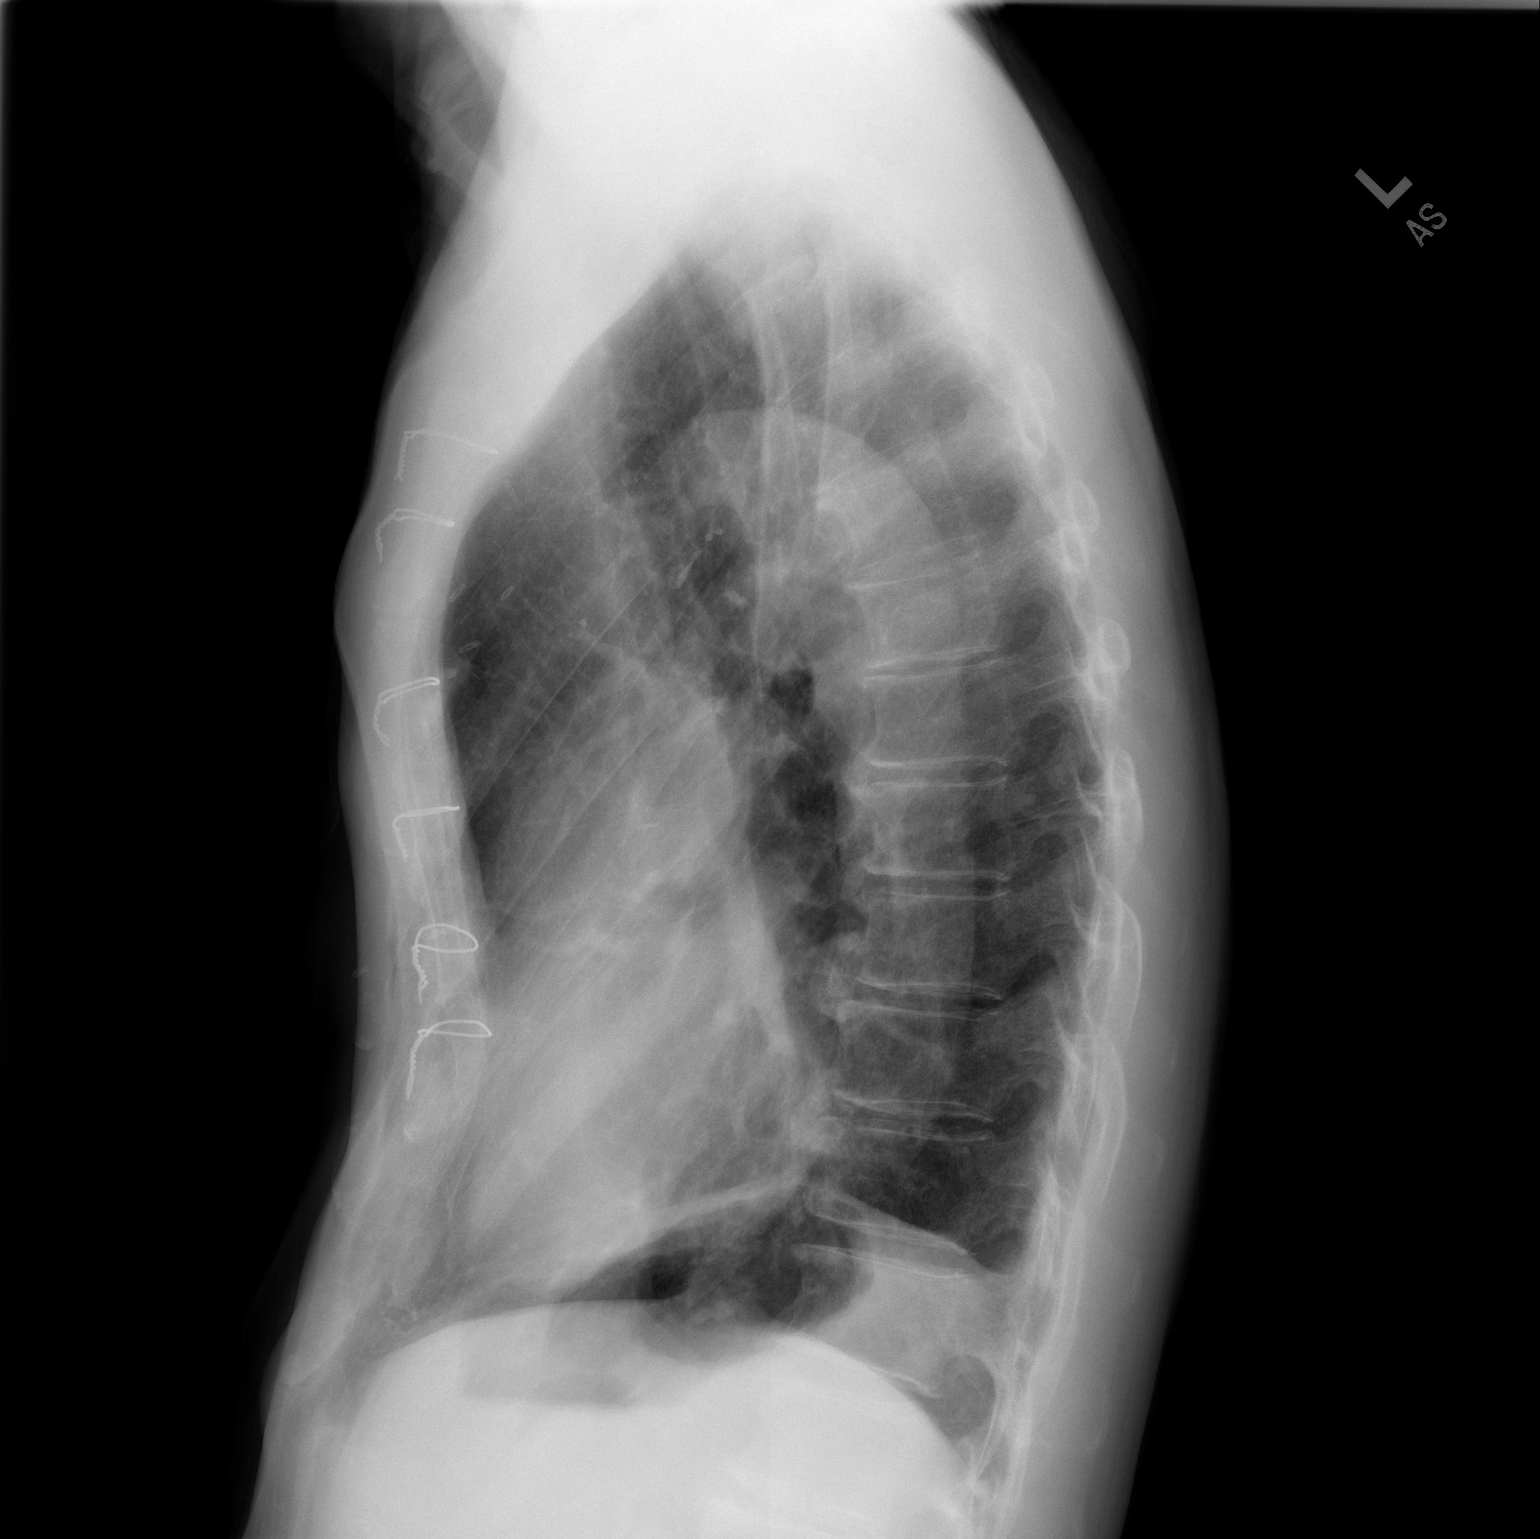

[2 of 2 positions shown; findings below may reference images not displayed]

FINDINGS: The cardiac silhouette, mediastinal and hilar contours are normal
and stable.

Stable scattered calcified pleural plaques. No acute pulmonary
findings or pleural effusions. No pneumothorax.
IMPRESSION: No acute cardiopulmonary findings.

## 2022-02-22 ENCOUNTER — Telehealth: Payer: Self-pay | Admitting: Internal Medicine

## 2022-02-22 MED ORDER — EZETIMIBE 10 MG PO TABS: 10.0000 mg | ORAL_TABLET | Freq: Every day | ORAL | 0 refills | Status: DC

## 2022-02-22 MED ORDER — CARVEDILOL 6.25 MG PO TABS: 6.2500 mg | ORAL_TABLET | Freq: Two times a day (BID) | ORAL | 0 refills | Status: DC

## 2022-02-22 NOTE — Telephone Encounter (Signed)
Requested Prescriptions   Signed Prescriptions Disp Refills   carvedilol (COREG) 6.25 MG tablet 180 tablet 0    Sig: Take 1 tablet (6.25 mg total) by mouth 2 (two) times daily.    Authorizing Provider: END, CHRISTOPHER    Ordering User: Eugenio Hoes, Velicia Dejager C   ezetimibe (ZETIA) 10 MG tablet 90 tablet 0    Sig: Take 1 tablet (10 mg total) by mouth daily.    Authorizing Provider: END, CHRISTOPHER    Ordering User: Britt Bottom

## 2022-02-22 NOTE — Telephone Encounter (Signed)
*  STAT* If patient is at the pharmacy, call can be transferred to refill team.   1. Which medications need to be refilled? (please list name of each medication and dose if known)  ezetimibe (ZETIA) 10 MG tablet carvedilol (COREG) 6.25 MG tablet  2. Which pharmacy/location (including street and city if local pharmacy) is medication to be sent to?  [facility] [address] - [place], [place] - [address] [place] [address]    3. Do they need a 30 day or 90 day supply?  [place]

## 2022-03-02 DIAGNOSIS — D509 Iron deficiency anemia, unspecified: Secondary | ICD-10-CM | POA: Insufficient documentation

## 2022-03-02 DIAGNOSIS — M899 Disorder of bone, unspecified: Secondary | ICD-10-CM | POA: Insufficient documentation

## 2022-03-04 DIAGNOSIS — D61818 Other pancytopenia: Secondary | ICD-10-CM | POA: Insufficient documentation

## 2022-03-04 DIAGNOSIS — R55 Syncope and collapse: Secondary | ICD-10-CM | POA: Insufficient documentation

## 2022-03-08 ENCOUNTER — Other Ambulatory Visit: Payer: Self-pay | Admitting: Internal Medicine

## 2022-03-09 ENCOUNTER — Other Ambulatory Visit: Payer: Self-pay | Admitting: Internal Medicine

## 2022-03-09 DIAGNOSIS — L03114 Cellulitis of left upper limb: Secondary | ICD-10-CM | POA: Insufficient documentation

## 2022-03-16 DIAGNOSIS — Z87898 Personal history of other specified conditions: Secondary | ICD-10-CM | POA: Insufficient documentation

## 2022-03-16 DIAGNOSIS — R441 Visual hallucinations: Secondary | ICD-10-CM | POA: Insufficient documentation

## 2022-04-21 DIAGNOSIS — D708 Other neutropenia: Secondary | ICD-10-CM | POA: Insufficient documentation

## 2022-04-21 DIAGNOSIS — C419 Malignant neoplasm of bone and articular cartilage, unspecified: Secondary | ICD-10-CM | POA: Insufficient documentation

## 2022-04-21 DIAGNOSIS — Z79818 Long term (current) use of other agents affecting estrogen receptors and estrogen levels: Secondary | ICD-10-CM | POA: Insufficient documentation

## 2022-06-12 ENCOUNTER — Other Ambulatory Visit: Payer: Self-pay | Admitting: Internal Medicine

## 2022-06-24 ENCOUNTER — Ambulatory Visit: Payer: Self-pay | Attending: Internal Medicine

## 2022-06-30 ENCOUNTER — Ambulatory Visit: Payer: Medicare Other | Admitting: Internal Medicine

## 2022-07-08 ENCOUNTER — Other Ambulatory Visit: Payer: Self-pay

## 2022-07-08 MED ORDER — EZETIMIBE 10 MG PO TABS: 10.0000 mg | ORAL_TABLET | Freq: Every day | ORAL | 1 refills | Status: AC

## 2022-07-22 ENCOUNTER — Ambulatory Visit: Payer: Medicare Other | Attending: Internal Medicine | Admitting: Nurse Practitioner

## 2022-07-22 ENCOUNTER — Encounter: Payer: Self-pay | Admitting: Nurse Practitioner

## 2022-07-22 VITALS — BP 148/70 | HR 66 | Ht 74.0 in | Wt 163.8 lb

## 2022-07-22 DIAGNOSIS — F0280 Dementia in other diseases classified elsewhere without behavioral disturbance: Secondary | ICD-10-CM

## 2022-07-22 DIAGNOSIS — I10 Essential (primary) hypertension: Secondary | ICD-10-CM | POA: Diagnosis not present

## 2022-07-22 DIAGNOSIS — I5022 Chronic systolic (congestive) heart failure: Secondary | ICD-10-CM | POA: Diagnosis not present

## 2022-07-22 DIAGNOSIS — I428 Other cardiomyopathies: Secondary | ICD-10-CM

## 2022-07-22 DIAGNOSIS — R072 Precordial pain: Secondary | ICD-10-CM

## 2022-07-22 DIAGNOSIS — I7121 Aneurysm of the ascending aorta, without rupture: Secondary | ICD-10-CM

## 2022-07-22 DIAGNOSIS — I4891 Unspecified atrial fibrillation: Secondary | ICD-10-CM

## 2022-07-22 DIAGNOSIS — G309 Alzheimer's disease, unspecified: Secondary | ICD-10-CM

## 2022-07-22 DIAGNOSIS — I9789 Other postprocedural complications and disorders of the circulatory system, not elsewhere classified: Secondary | ICD-10-CM

## 2022-07-22 NOTE — Patient Instructions (Signed)
Medication Instructions:  Your physician recommends that you continue on your current medications as directed. Please refer to the Current Medication list given to you today.  *If you need a refill on your cardiac medications before your next appointment, please call your pharmacy*  Lab Work: -None ordered  Testing/Procedures: Your physician has requested that you have an echocardiogram. Echocardiography is a painless test that uses sound waves to create images of your heart. It provides your doctor with information about the size and shape of your heart and how well your heart's chambers and valves are working. This procedure takes approximately one hour. There are no restrictions for this procedure. Please do NOT wear cologne, perfume, aftershave, or lotions (deodorant is allowed). Please arrive 15 minutes prior to your appointment time.   Follow-Up: At [facility], you and your health needs are our priority.  As part of our continuing mission to provide you with exceptional heart care, we have created designated Provider Care Teams.  These Care Teams include your primary Cardiologist (physician) and Advanced Practice Providers (APPs -  Physician Assistants and Nurse Practitioners) who all work together to provide you with the care you need, when you need it.  Your next appointment:   1 month(s)  Provider:   Yvonne Kendall, MD or Nicolasa Ducking, NP    Other Instructions -Check blood pressure 3 - 5 times a week

## 2022-07-22 NOTE — Progress Notes (Signed)
Office Visit    Patient Name: Justin Contreras Date of Encounter: 07/22/2022  Primary Care Provider:  Elijio Miles., MD Primary Cardiologist:  Yvonne Kendall, MD  Chief Complaint    82 y.o. 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  Past Medical History    Past Medical History:  Diagnosis Date   Bronchiectasis (HCC)    Chronic HFrEF (heart failure with reduced ejection fraction) (HCC)    Hyperlipidemia    Hypertension    NICM (nonischemic cardiomyopathy) (HCC)    a. 09/2020 Echo: EF 45%, glob HK, GrI DD; b. 10/2020 Cath: Nl cors. Nl R heart filling pressures. Nl CO/CI; c. 10/2020 Echo: EF 35-40%, glob HK; d. 06/2021 Echo: EF 35-40%, glob HK, mod LVH, GrI DD, nl RV fxn, RVSP 15.22mmHg. Mod dil LA, mild MR/AI. Ao sclerosis. Ao root 40mm.   PAF (paroxysmal atrial fibrillation) (HCC)    a. post-op Afib after Asc Ao aneurysm repair.   Pneumonia    Prostate cancer [facility])    Thoracic aortic aneurysm (HCC)    a. 09/2020 CTA Chest: 58mm Asc TAA; b. 10/2020 s/p 32mm Hemashield graft w/ separate anastomoses to innominate and L common carotid; c. 10/2020 Echo: Mild Ao root dil @ 39mm; d. 06/2021 Echo: Ao root 40mm.   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;  Surgeon: Marcine Matar, MD;  Location: [facility] ORS;  Service: Urology;  Laterality: N/A;  30 MINS   KNEE ARTHROSCOPY Left    2014   LOBECTOMY Left    Left lower lobectomy   mohs micrographic surgery nose     MOHS SURGERY Right 03/09/2021   Right ear   partial remove lung     2008    prostate surgery/transperinal placement of needles     RIGHT/LEFT HEART CATH AND CORONARY ANGIOGRAPHY N/A 10/20/2020   Procedure: RIGHT/LEFT HEART CATH AND CORONARY ANGIOGRAPHY;  Surgeon: Yvonne Kendall, MD;  Location: [facility] INVASIVE CV LAB;  Service: Cardiovascular;  Laterality: N/A;   TEE WITHOUT CARDIOVERSION N/A 11/03/2020   Procedure: TRANSESOPHAGEAL ECHOCARDIOGRAM (TEE);  Surgeon: Loreli Slot, MD;  Location: [facility] OR;  Service: Open Heart Surgery;  Laterality: N/A;   THORACIC AORTIC ANEURYSM REPAIR N/A 11/03/2020   Procedure: THORACIC ASCENDING ANEURYSM REPAIR (AAA) USING HEMASHIELD PLATINUM GRAFT;  Surgeon: Loreli Slot, MD;  Location: [facility] OR;  Service: Open Heart Surgery;  Laterality: N/A;  WITH CIRC ARREST    Allergies  Allergies  Allergen Reactions   Amoxicillin Other (See Comments)    Bad taste in mouth & tongue gets fuzzy feeling   Statins     Oral problems and bad taste Fatigue    History of Present Illness      82 y.o. y/o male with the above past medical history including thoracic aortic aneurysm, postoperative atrial fibrillation, hypertension, hyperlipidemia, statin intolerance, nonischemic cardiomyopathy, HFrEF, bronchiectasis status post left lung resection, memory loss, and prostate cancer. In mid 2022, in the setting of an elevated PSA and prior history of prostate cancer, he underwent PET/CT, which showed recurrence of prostate cancer and incidentally showed an ascending thoracic aortic aneurysm. Follow-up CTA of the chest  showed a 58 mm ascending thoracic aortic aneurysm. He was seen by CT surgery and underwent echocardiography which showed an EF of 45% with global hypokinesis and grade 1 diastolic dysfunction. He was experiencing dyspnea on exertion and was referred to Dr. Okey Dupre, and subsequently underwent right and left heart diagnostic catheterization in October 2022, revealing normal coronary arteries, filling pressures, and cardiac output/index.  He subsequently underwent ascending aortic and proximal arch aneurysm repair with a 32 mm Hemashield graft on November 03, 2020. Postoperative course was complicated by atrial fibrillation requiring initiation of amiodarone therapy with subsequent conversion to sinus rhythm. Follow-up echo postoperatively showed EF 35 to 40% with global hypokinesis, trivial pericardial effusion, and mild aortic root dilatation at 39 mm.  Most recent echo in June 2023 showed persistent LV dysfunction with EF 35 to 40% global hypokinesis.   Justin Contreras was last seen in cardiology clinic in December 2023, at which time he was feeling well with occasional elevations in heart rate.  Over the past several months, especially in the setting of high temperatures outside, he has noted more frequent dyspnea on exertion and occasional chest discomfort.  About 2 to 3 months ago, his son, who is present with him today, reports that he had a more profound period of dyspnea that lasted the better part of the weekend, and then resolve spontaneously.  Mr. Bonura has been prepping his yard for a wedding shower in August and in that setting, has been frequently outside watering his plants and doing assorted other things around the yard.  In that setting, he has noted dyspnea and occasional chest discomfort which will persist as long as he is outside, but does not necessarily slow him down.  He denies palpitations, PND, orthopnea, dizziness, syncope, edema, or early satiety.  He has had some constipation, which led to him discontinuing oral iron.  He recently saw his hematologist with plan for intravenous iron infusion next week.  Home Medications    Current Outpatient Medications  Medication Sig Dispense Refill   aspirin EC 81 MG tablet Take 1 tablet (81 mg total) by mouth daily. Swallow whole. 90 tablet 0   carvedilol (COREG) 6.25 MG tablet TAKE ONE TABLET BY MOUTH TWICE A DAY 180 tablet 0   donepezil (ARICEPT) 10 MG tablet Take 10 mg by mouth  daily.     ezetimibe (ZETIA) 10 MG tablet Take 1 tablet (10 mg total) by mouth daily. 90 tablet 1   GEMTESA 75 MG TABS Take 1 tablet by mouth daily.     silodosin (RAPAFLO) 8 MG CAPS capsule Take 8 mg by mouth at bedtime.     B Complex Vitamins (B COMPLEX PO) Place 1 mL under the tongue daily. (Patient not taking: Reported on 07/22/2022)     losartan (COZAAR) 100 MG tablet TAKE 1 TABLET BY MOUTH EVERY DAY 90 tablet 1   Pediatric Multiple Vitamins (FLINTSTONES MULTIVITAMIN PO) Take 1 tablet by mouth daily. (Patient not taking: Reported on 07/22/2022)     Polyethyl Glycol-Propyl Glycol (SYSTANE OP) Place 1 drop into both eyes daily as needed (dry eyes). (Patient not taking: Reported on 07/22/2022)     No current facility-administered medications for this visit.     Review of Systems    Dyspnea on exertion worse in the setting of warmer weather recently.  Also intermittent exertional chest discomfort.  Constipation.  He denies palpitations, PND, orthopnea, dizziness, syncope, edema, or early satiety.  All other systems reviewed and are otherwise negative except as  noted above.    Physical Exam    VS:  BP (!) 144/66 (BP Location: Left Arm, Patient Position: Sitting, Cuff Size: Normal)   Pulse 66   Ht 6\' 2"  (1.88 m)   Wt 163 lb 12.8 oz (74.3 kg)   SpO2 97%   BMI 21.03 kg/m  , BMI Body mass index is 21.03 kg/m.     Vitals:   07/22/22 1440 07/22/22 1540  BP: (!) 144/66 (!) 148/70  Pulse: 66   SpO2: 97%     GEN: Well nourished, well developed, in no acute distress. HEENT: normal. Neck: Supple, no JVD, carotid bruits, or masses. Cardiac: RRR, no murmurs, rubs, or gallops. No clubbing, cyanosis, edema.  Radials 2+/PT 2+ and equal bilaterally.  Respiratory:  Respirations regular and unlabored, clear to auscultation bilaterally. GI: Soft, nontender, nondistended, BS + x 4. MS: no deformity or atrophy. Skin: warm and dry, no rash. Neuro:  Strength and sensation are intact. Psych: Normal  affect.  Accessory Clinical Findings    ECG personally reviewed by me today - EKG Interpretation Date/Time:  Thursday July 22 2022 14:49:05 EDT Ventricular Rate:  66 PR Interval:  166 QRS Duration:  158 QT Interval:  480 QTC Calculation: 503 R Axis:   -28  Text Interpretation: Sinus rhythm with Premature atrial complexes Left bundle branch block Confirmed by Nicolasa Ducking [phone]) on 07/22/2022 3:02:56 PM  - no acute changes.  Labs dated July 22, 2022 from Care Everywhere:  Hemoglobin 10.7, hematocrit 34.0, WBC 2.6, platelets 148 Sodium 141, potassium 4.2, chloride 105, CO2 30, BUN 18, creatinine 0.93 Total bilirubin 0.6, alkaline phosphatase 100, AST 15, ALT 9 Total protein 6.9, albumin 4.2, calcium 9.6 Serum iron 41, transferrin 379, TIBC 542, transferrin saturation 8  Labs dated June 14, 2022 from Care Everywhere:  Total cholesterol 154, triglycerides 54, HDL 63, LDL 78 _____________    Assessment & Plan    1.  Nonischemic cardiomyopathy/chronic HFrEF: EF 35 to 40% by echo June 2023.  Following his last visit, recommendation was made for follow-up echo prior to this visit however, this was not carried out.  Patient has been having more frequent dyspnea on exertion, especially when it is very hot outside.  This is sometimes associated with discomfort in his chest.  There was an episode 2 to 3 months ago, where he felt very short of breath over this past weekend, which subsequently subsided.  ECG shows persistent left bundle branch block with occasional PACs.  He is euvolemic on examination today.  His son is present with him today.  I will arrange for 2D echocardiogram to reevaluate his LV function and wall motion.  Pending echo result, we will determine most appropriate ischemic evaluation as if EF is lower, we may wish to pursue diagnostic catheterization.  He remains on beta-blocker and ARB therapy.  His blood pressure was elevated today however, his blood pressure was normal at  hematology visit this morning and per son, pressure typically runs "low."  Will hold off on transitioning to [facility] or adding spironolactone at this time.  Previously identified is a poor candidate for SGLT2 inhibitors in the setting of urologic issues.  2.  Precordial chest pain: History of normal coronary arteries by catheterization 2022.  As above, patient has been having dyspnea and intermittent chest tightness, especially when working outside in the heat.  Symptoms do not typically limit his activity but are present nonetheless.  Echo planned as above.  If EF is stable, would likely  pursue noninvasive ischemic evaluation however, if EF worse, will need to consider cath.  Continue aspirin, beta-blocker, and Zetia.  3.  Essential hypertension: Blood pressure elevated today at 144/66.  He is currently on carvedilol 6.25 mg twice daily and losartan 100 mg daily.  Pressure at hematology office this morning was 136/74.  We agreed to have patient follow his blood pressures at home over the next 2 weeks and contact us with trends, as if he is trending higher, transitioning from losartan to [facility] and/or considering spironolactone would be appropriate.  4.  Hyperlipidemia: LDL of 78 on Zetia.  Statin intolerant.  5.  Thoracic aortic aneurysm s/p repair: Aortic root 40 mm by echo June 2023.  Reassessment with pending echo.  6.  Postoperative atrial fibrillation: This occurred following his ascending thoracic aortic aneurysm repair and has since been quiescent.  Continue beta-blocker therapy.  Not anticoagulated.  7.  Alzheimer's: Followed by primary care.  On Aricept.  8.  Disposition: Follow-up 2D echocardiogram.  Follow-up in clinic in 1 month or sooner if necessary.  Informed Consent    Nicolasa Ducking, NP 07/22/2022, 3:03 PM

## 2022-08-19 ENCOUNTER — Ambulatory Visit: Payer: Medicare Other

## 2022-08-30 ENCOUNTER — Encounter: Payer: Self-pay | Admitting: *Deleted

## 2022-08-30 ENCOUNTER — Ambulatory Visit: Payer: Medicare Other | Attending: Cardiology | Admitting: Cardiology

## 2022-08-30 ENCOUNTER — Encounter: Payer: Self-pay | Admitting: Cardiology

## 2022-08-30 VITALS — BP 102/70 | HR 72 | Ht 74.0 in | Wt 157.0 lb

## 2022-08-30 DIAGNOSIS — I10 Essential (primary) hypertension: Secondary | ICD-10-CM | POA: Diagnosis not present

## 2022-08-30 DIAGNOSIS — I9789 Other postprocedural complications and disorders of the circulatory system, not elsewhere classified: Secondary | ICD-10-CM

## 2022-08-30 DIAGNOSIS — I4891 Unspecified atrial fibrillation: Secondary | ICD-10-CM

## 2022-08-30 DIAGNOSIS — I7121 Aneurysm of the ascending aorta, without rupture: Secondary | ICD-10-CM | POA: Diagnosis not present

## 2022-08-30 DIAGNOSIS — I5022 Chronic systolic (congestive) heart failure: Secondary | ICD-10-CM

## 2022-08-30 DIAGNOSIS — R0600 Dyspnea, unspecified: Secondary | ICD-10-CM

## 2022-08-30 DIAGNOSIS — R072 Precordial pain: Secondary | ICD-10-CM

## 2022-08-30 DIAGNOSIS — E782 Mixed hyperlipidemia: Secondary | ICD-10-CM

## 2022-08-30 DIAGNOSIS — Z860100 Personal history of colon polyps, unspecified: Secondary | ICD-10-CM | POA: Insufficient documentation

## 2022-08-30 DIAGNOSIS — I428 Other cardiomyopathies: Secondary | ICD-10-CM

## 2022-08-30 NOTE — Patient Instructions (Signed)
Medication Instructions:  Your physician recommends that you continue on your current medications as directed. Please refer to the Current Medication list given to you today.  *If you need a refill on your cardiac medications before your next appointment, please call your pharmacy*   Lab Work: Your physician recommends that you return for lab work in: Pt will take order to nearest [facility] to be drawn. Needs ProBNP and BMP  If you have labs (blood work) drawn today and your tests are completely normal, you will receive your results only by: MyChart Message (if you have MyChart) OR A paper copy in the mail If you have any lab test that is abnormal or we need to change your treatment, we will call you to review the results.   Testing/Procedures: Your physician has requested that you have an echocardiogram. Echocardiography is a painless test that uses sound waves to create images of your heart. It provides your doctor with information about the size and shape of your heart and how well your heart's chambers and valves are working. This procedure takes approximately one hour. There are no restrictions for this procedure. Please do NOT wear cologne, perfume, aftershave, or lotions (deodorant is allowed). Please arrive 15 minutes prior to your appointment time.    Follow-Up: At [facility], you and your health needs are our priority.  As part of our continuing mission to provide you with exceptional heart care, we have created designated Provider Care Teams.  These Care Teams include your primary Cardiologist (physician) and Advanced Practice Providers (APPs -  Physician Assistants and Nurse Practitioners) who all work together to provide you with the care you need, when you need it.  We recommend signing up for the patient portal called "MyChart".  Sign up information is provided on this After Visit Summary.  MyChart is used to connect with patients for Virtual Visits (Telemedicine).   Patients are able to view lab/test results, encounter notes, upcoming appointments, etc.  Non-urgent messages can be sent to your provider as well.   To learn more about what you can do with MyChart, go to [email or web address].    Your next appointment:   4 week(s)  Provider:   Dr. Brion Aliment or End in [place]

## 2022-08-30 NOTE — Progress Notes (Signed)
Cardiology Office Note:  .   Date:  08/30/2022  ID:  Justin Contreras, DOB Mar 03, 1940, MRN [id number] PCP: Yvonne Kendall, MD  [facility] Providers Cardiologist:  Yvonne Kendall, MD    History of Present Illness: .   Justin Contreras is a 82 y.o. 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  03/01/22 echo EF 45-50%, mild AR, mild MR 06/26/2021 echo EF 35 to 40%, global hypokinesis, moderate LVH, grade 1 DD, mild MR, mild AR, mild dilatation of the aortic root 40 mm 11/03/20 AAA repair 10/20/2020 left heart cath no significant CAD 10/06/2020 echo EF 45%, mild to moderately decreased function, global hypokinesis, mild LVH, grade 1 DD, mild MR, mild aortic valve sclerosis without stenosis present  Evaluated on 07/22/2022 by Nicolasa Ducking NP, predominant complaint was dyspnea and occasional chest discomfort that was associated with working outside in the heat.  His blood pressure was mildly elevated however his son reported that is typically low at home.  Evaluated the emergency department on 08/29/22 with complaints of chest pain associated with shortness of breath.  BNP was elevated at 611, troponin negative, chest x-ray negative, cardiology was consulted and advised to start on low-dose diuretic.   He presents today accompanied by his son for follow-up after his ED visit yesterday, there was some confusion and they thought they were presenting for an echocardiogram today.  He started Lasix today, did notice good response.  Leading up to his ED visit yesterday, he denied any orthopnea, weight gain, worsening shortness of breath.  He has been complaining of shortness of breath for sometimes however it has been more of a chronic, ongoing issue for him that he  related to the heat. He has intermittent chest pain that is hard for him to verbalize, but does not sound to be anginal in nature. He states the "wires" from his sternotomy poke him at times. He denies chest pain, palpitations, dyspnea, pnd, orthopnea, n, v, dizziness, syncope, edema, weight gain, or early satiety.    ROS: Review of Systems  Constitutional: Negative.   HENT: Negative.    Eyes: Negative.   Respiratory: Negative.    Cardiovascular: Negative.   Gastrointestinal: Negative.   Musculoskeletal: Negative.   Skin: Negative.   Neurological: Negative.   Endo/Heme/Allergies: Negative.   Psychiatric/Behavioral:  Positive for memory loss.     Studies Reviewed: .       Cardiac Studies & Procedures   CARDIAC CATHETERIZATION  CARDIAC CATHETERIZATION 10/20/2020  Narrative Conclusions: No angiographically significant coronary artery disease. Normal left heart, right heart, and pulmonary artery pressures. Normal Fick cardiac output/index.  Recommendations: Continue optimization of goal-directed medical therapy, as tolerated, for management of nonischemic cardiomyopathy. Ongoing management of thoracic aortic aneurysm per Dr. Dorris Fetch.  Defer addition of statin at this time given reasonable LDL (96) and history of statin intolerance.  Yvonne Kendall, MD [facility]  Findings Coronary Findings Diagnostic  Dominance: Right  Left Main Vessel is large.  Left Anterior Descending Vessel is large. Vessel is angiographically normal.  First Diagonal Branch Vessel is small in size.  Second Diagonal Branch Vessel is moderate in size.  Third Diagonal Branch Vessel is small in size.  Left Circumflex Vessel is large. Vessel is angiographically normal.  First Obtuse Marginal Branch Vessel is small in size.  Second Obtuse Marginal  Branch Vessel is large in size.  Third Obtuse Marginal Branch Vessel is small in size.  Right Coronary Artery Vessel is large. Vessel  is angiographically normal.  Right Posterior Descending Artery Vessel is large in size.  First Right Posterolateral Branch Vessel is small in size.  Second Right Posterolateral Branch Vessel is moderate in size.  Third Right Posterolateral Branch Vessel is small in size.  Intervention  No interventions have been documented.     ECHOCARDIOGRAM  ECHOCARDIOGRAM COMPLETE 06/26/2021  Narrative ECHOCARDIOGRAM REPORT    Patient Name:   Justin Contreras Date of Exam: 06/26/2021 Medical Rec #:  [id number]       Height:       74.0 in Accession #:    [id number]      Weight:       167.0 lb Date of Birth:  06-11-1940       BSA:          2.013 m Patient Age:    80 years        BP:           144/82 mmHg Patient Gender: M               HR:           62 bpm. Exam Location:  [facility]  Procedure: 2D Echo, Cardiac Doppler, Color Doppler and Strain Analysis  Indications:    Chronic HFrEF (heart failure with reduced ejection fraction) (HCC) [I50.22 (ICD-10-CM)]  History:        Patient has prior history of Echocardiogram examinations, most recent 11/06/2020. Cardiomyopathy, S/P ascending aortic aneurysm repair, COPD and Prostate cancer, Arrythmias:Atrial Fibrillation; Risk Factors:Hypertension and Dyslipidemia.  Sonographer:    Louie Boston RDCS Referring Phys: [phone] [name]  IMPRESSIONS   1. Left ventricular ejection fraction, by estimation, is 35 to 40%. The left ventricle has moderately decreased function. The left ventricle demonstrates global hypokinesis. There is moderate left ventricular hypertrophy. Left ventricular diastolic parameters are consistent with Grade I diastolic dysfunction (impaired relaxation). The average left ventricular global longitudinal strain is -14.1 %. 2. Right ventricular systolic function is normal. The right ventricular size is normal. There is normal pulmonary artery systolic pressure. The estimated right ventricular systolic pressure is 15.8  mmHg. 3. Left atrial size was moderately dilated. 4. The mitral valve is normal in structure. Mild mitral valve regurgitation. No evidence of mitral stenosis. 5. The aortic valve is tricuspid. Aortic valve regurgitation is mild. Aortic valve sclerosis is present, with no evidence of aortic valve stenosis. 6. There is mild dilatation of the aortic root, measuring 40 mm. 7. The inferior vena cava is normal in size with greater than 50% respiratory variability, suggesting right atrial pressure of 3 mmHg.  FINDINGS Left Ventricle: Left ventricular ejection fraction, by estimation, is 35 to 40%. The left ventricle has moderately decreased function. The left ventricle demonstrates global hypokinesis. The average left ventricular global longitudinal strain is -14.1 %. The left ventricular internal cavity size was normal in size. There is moderate left ventricular hypertrophy. Left ventricular diastolic parameters are consistent with Grade I diastolic dysfunction (impaired relaxation).  Right Ventricle: The right ventricular size is normal. No increase in right ventricular wall thickness. Right ventricular systolic function is normal. There is normal pulmonary artery systolic pressure. The tricuspid regurgitant velocity is 1.79 m/s, and with an assumed right atrial pressure of 3 mmHg, the estimated right ventricular systolic pressure is 15.8 mmHg.  Left Atrium: Left atrial size was moderately dilated.  Right Atrium: Right atrial size was normal in size.  Pericardium: There is no evidence of pericardial effusion.  Mitral Valve: The mitral valve is normal in structure. Mild mitral valve regurgitation. No evidence of mitral valve stenosis.  Tricuspid Valve: The tricuspid valve is normal in structure. Tricuspid valve regurgitation is mild . No evidence of tricuspid stenosis.  Aortic Valve: The aortic valve is tricuspid. Aortic valve regurgitation is mild. Aortic regurgitation PHT measures 684 msec.  Aortic valve sclerosis is present, with no evidence of aortic valve stenosis.  Pulmonic Valve: The pulmonic valve was normal in structure. Pulmonic valve regurgitation is not visualized. No evidence of pulmonic stenosis.  Aorta: The aortic root is normal in size and structure. There is mild dilatation of the aortic root, measuring 40 mm.  Venous: The inferior vena cava is normal in size with greater than 50% respiratory variability, suggesting right atrial pressure of 3 mmHg.  IAS/Shunts: No atrial level shunt detected by color flow Doppler.   LEFT VENTRICLE PLAX 2D LVIDd:         5.20 cm   Diastology LVIDs:         4.20 cm   LV e' medial:    2.94 cm/s LV PW:         1.40 cm   LV E/e' medial:  14.1 LV IVS:        1.40 cm   LV e' lateral:   4.62 cm/s LVOT diam:     2.20 cm   LV E/e' lateral: 9.0 LV SV:         94 LV SV Index:   47        2D Longitudinal Strain LVOT Area:     3.80 cm  2D Strain GLS Avg:     -14.1 %   RIGHT VENTRICLE            IVC RV S prime:     8.68 cm/s  IVC diam: 1.10 cm TAPSE (M-mode): 1.8 cm  LEFT ATRIUM             Index        RIGHT ATRIUM           Index LA diam:        3.90 cm 1.94 cm/m   RA Area:     16.60 cm LA Vol (A2C):   99.3 ml 49.33 ml/m  RA Volume:   49.20 ml  24.44 ml/m LA Vol (A4C):   62.2 ml 30.90 ml/m LA Biplane Vol: 82.3 ml 40.88 ml/m AORTIC VALVE             PULMONIC VALVE LVOT Vmax:   97.90 cm/s  PR End Diast Vel: 1.99 msec LVOT Vmean:  65.600 cm/s LVOT VTI:    0.247 m AI PHT:      684 msec  AORTA Ao Root diam:  4.10 cm Ao Sinus diam: 3.85 cm Ao STJ diam:   3.3 cm Ao Asc diam:   3.40 cm Ao Desc diam:  2.40 cm  MITRAL VALVE                  TRICUSPID VALVE MV Area (PHT): 3.05 cm       TR Peak grad:   12.8 mmHg MV Decel Time: 249 msec       TR Vmax:        179.00 cm/s MR Peak grad:    109.8 mmHg MR Mean grad:    57.0 mmHg  SHUNTS MR Vmax:         524.00 cm/s  Systemic VTI:  0.25 m MR Vmean:        349.0 cm/s   Systemic  Diam: 2.20 cm MR PISA:         4.02 cm MR PISA Eff ROA: 27 mm MR PISA Radius:  0.80 cm MV E velocity: 41.60 cm/s MV A velocity: 59.10 cm/s MV E/A ratio:  0.70  Julien Nordmann MD Electronically signed by Julien Nordmann MD Signature Date/Time: 06/26/2021/5:54:08 PM    Final   TEE  ECHO INTRAOPERATIVE TEE 11/03/2020  Narrative *INTRAOPERATIVE TRANSESOPHAGEAL REPORT *    Patient Name:   Justin Contreras Date of Exam: 11/03/2020 Medical Rec #:  [id number]       Height:       74.0 in Accession #:    [id number]      Weight:       156.0 lb Date of Birth:  08-29-1940       BSA:          1.96 m Patient Age:    80 years        BP:           146/54 mmHg Patient Gender: M               HR:           76 bpm. Exam Location:  Anesthesiology  Transesophogeal exam was perform intraoperatively during surgical procedure. Patient was closely monitored under general anesthesia during the entirety of examination.  Indications:     I71.2 Ascending aortic aneurysm Sonographer:     Irving Burton Senior RDCS Performing Phys: [id number] HENDRICKSON Diagnosing Phys: Heather Roberts MD  Complications: No known complications during this procedure. POST-OP IMPRESSIONS _ Aorta: A graft was placed in the ascending aorta for repair.Aortic graft seen in good position. Aortic valve with significantly less AI. _ Aortic Valve: No stenosis present. There is mild regurgitation. _ Comments: Aortic valve with mild AI, aortic graft in place. Otherwise exam unchanged.  PRE-OP FINDINGS Left Ventricle: The left ventricle has moderately reduced systolic function, with an ejection fraction of 35-40% 32%. The cavity size was moderately dilated. There is mild left ventricular hypertrophy.   Right Ventricle: The right ventricle has normal systolic function. The cavity was normal. There is no increase in right ventricular wall thickness.  Left Atrium: Left atrial size was normal in size. No left atrial/left atrial  appendage thrombus was detected.  Right Atrium: Right atrial size was normal in size.  Interatrial Septum: No atrial level shunt detected by color flow Doppler.  Pericardium: There is no evidence of pericardial effusion.  Mitral Valve: The mitral valve is normal in structure. Mitral valve regurgitation is trivial by color flow Doppler. There is No evidence of mitral stenosis.  Tricuspid Valve: The tricuspid valve was normal in structure. Tricuspid valve regurgitation is trivial by color flow Doppler.  Aortic Valve: The aortic valve is tricuspid Aortic valve regurgitation is moderate by color flow Doppler. The jet is posteriorly-directed. There is no stenosis of the aortic valve.   Pulmonic Valve: The pulmonic valve was normal in structure. Pulmonic valve regurgitation is trivial by color flow Doppler.    Heather Roberts MD Electronically signed by Heather Roberts MD Signature Date/Time: 11/03/2020/3:22:59 PM    Final            Risk Assessment/Calculations:    CHA2DS2-VASc Score = 3   This indicates a 3.2% annual  risk of stroke. The patient's score is based upon: CHF History: 0 HTN History: 1 Diabetes History: 0 Stroke History: 0 Vascular Disease History: 0 Age Score: 2 Gender Score: 0            Physical Exam:   VS:  BP 102/70 (BP Location: Left Arm, Patient Position: Sitting, Cuff Size: Normal)   Pulse 72   Ht 6\' 2"  (1.88 m)   Wt 157 lb (71.2 kg)   SpO2 93%   BMI 20.16 kg/m    Wt Readings from Last 3 Encounters:  08/30/22 157 lb (71.2 kg)  07/22/22 163 lb 12.8 oz (74.3 kg)  12/16/21 167 lb (75.8 kg)    GEN: Thin, well developed in no acute distress NECK: No JVD; No carotid bruits CARDIAC: RRR, 2/6 systolic murmur, rubs, gallops RESPIRATORY:  Clear to auscultation without rales, wheezing or rhonchi  ABDOMEN: Soft, non-tender, non-distended EXTREMITIES:  No edema; No deformity   ASSESSMENT AND PLAN: .   HFrEF- echo on 02/2022 EF 45-50%, mild AR, mild MR.  NYHA class I-II, euvolemic.  Lasix was started today following ED visit, Cr 0.73 yesterday. Continue carvedilol 6.25 mg twice daily, continue Lasix 20 mg daily, continue losartan 50 mg daily--do not think his BP could tolerate changing from ARB > ARNi.  Not a candidate for SGLT2 inhibitor secondary to prostate cancer and BPH.  Will arrange for repeat echocardiogram.   Precordial chest pain- LHC in 2022 with no significant CAD. Repeating echo per above, if EF is further decreased could consider ischemic evaluation. Pain does not appear to be anginal in nature, he points directly to his sternotomy site where the wires were used for sternotomy closure.   Hypertension-blood pressure is well-controlled at 102/70, continue Coreg 6.25 mg twice daily, continue losartan 50 mg daily.  Hyperlipidemia - most recent LDL was 96, he is intolerant of statins, continue Zetia 10 mg daily.  TAA- s/p repair 2022, most recent echo with mild dilatation of the aortic root 40 mm. Will re-evaluate on repeat echo.   PAF-CHA2DS2-VASc score of 3, this occurred in the postoperative setting following his TAA repair, no further episodes reported, not currently on DOAC, if he has repeat episode of atrial fibrillation we will need to consider anticoagulation at that time.  Alzheimer's disease-managed by his PCP, on Aricept  Aortic regurgitation - mild per most recent echo, 2/6 murmur appreciated. Repeating echo.         Dispo: Echocardiogram--would prefer to have in [place], repeat proBNP and BMET in 4 days, return to general cardiology in 4 weeks after echo in [place].  Signed, Flossie Dibble, NP

## 2022-08-31 ENCOUNTER — Telehealth: Payer: Self-pay | Admitting: Cardiology

## 2022-08-31 NOTE — Telephone Encounter (Signed)
Pt's son is requesting a callback regarding lab orders. Please advise

## 2022-08-31 NOTE — Telephone Encounter (Signed)
Spoke with son regarding [facility] locations for fathers lab work on Friday

## 2022-09-09 ENCOUNTER — Ambulatory Visit: Payer: Medicare Other | Admitting: Nurse Practitioner

## 2022-09-14 ENCOUNTER — Ambulatory Visit (HOSPITAL_BASED_OUTPATIENT_CLINIC_OR_DEPARTMENT_OTHER): Payer: Medicare Other

## 2022-09-16 ENCOUNTER — Ambulatory Visit (HOSPITAL_BASED_OUTPATIENT_CLINIC_OR_DEPARTMENT_OTHER): Payer: Medicare Other

## 2022-09-20 ENCOUNTER — Other Ambulatory Visit: Payer: Self-pay | Admitting: Thoracic Surgery (Cardiothoracic Vascular Surgery)

## 2022-09-20 DIAGNOSIS — I7121 Aneurysm of the ascending aorta, without rupture: Secondary | ICD-10-CM

## 2022-09-28 ENCOUNTER — Ambulatory Visit (HOSPITAL_BASED_OUTPATIENT_CLINIC_OR_DEPARTMENT_OTHER): Payer: Medicare Other | Attending: Nurse Practitioner

## 2022-09-28 ENCOUNTER — Other Ambulatory Visit: Payer: Self-pay | Admitting: Internal Medicine

## 2022-10-15 ENCOUNTER — Encounter: Payer: Self-pay | Admitting: Nurse Practitioner

## 2022-10-15 ENCOUNTER — Ambulatory Visit: Payer: Medicare Other | Attending: Nurse Practitioner | Admitting: Nurse Practitioner

## 2022-10-15 NOTE — Progress Notes (Deleted)
Office Visit    Patient Name: Justin Contreras Date of Encounter: 10/15/2022  Primary Care Provider:  Yvonne Kendall, MD Primary Cardiologist:  Yvonne Kendall, MD  Chief Complaint    82 y.o. male  with a history of thoracic aortic aneurysm status postrepair in October 2022, postoperative atrial fibrillation, hypertension, hyperlipidemia, statin intolerance, nonischemic cardiomyopathy (EF 35 to 40%), HFrEF, bronchiectasis status post left lung resection, memory loss, and prostate cancer who presents for follow-up related to ***  Past Medical History    Past Medical History:  Diagnosis Date   Alzheimer disease (HCC)    Aortic regurgitation 2024   Bronchiectasis (HCC)    Chronic HFrEF (heart failure with reduced ejection fraction) (HCC)    Hyperlipidemia    Hypertension    NICM (nonischemic cardiomyopathy) (HCC)    a. 09/2020 Echo: EF 45%, glob HK, GrI DD; b. 10/2020 Cath: Nl cors. Nl R heart filling pressures. Nl CO/CI; c. 10/2020 Echo: EF 35-40%, glob HK; d. 06/2021 Echo: EF 35-40%, glob HK; e. 02/2022 Echo ([facility]): EF 45-50%, abnl paradoxical septal motion - lbbb, nl RV size/fxn, mildly dil LA, mild AI/MR.   PAF (paroxysmal atrial fibrillation) (HCC) 2022   a. post-op Afib after Asc Ao aneurysm repair.   Pneumonia    Prostate cancer [facility])    Thoracic aortic aneurysm (HCC)    a. 09/2020 CTA Chest: 58mm Asc TAA; b. 10/2020 s/p 32mm Hemashield graft w/ separate anastomoses to innominate and L common carotid; c. 10/2020 Echo: Mild Ao root dil @ 39mm; d. 06/2021 Echo: Ao root 40mm.   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;  Surgeon: Marcine Matar, MD;  Location: [facility];  Service: Urology;  Laterality: N/A;  30 MINS   KNEE ARTHROSCOPY Left    2014   LOBECTOMY Left    Left lower lobectomy   mohs micrographic surgery nose     MOHS SURGERY  Right 03/09/2021   Right ear   partial remove lung     2008   prostate surgery/transperinal placement of needles     RIGHT/LEFT HEART CATH AND CORONARY ANGIOGRAPHY N/A 10/20/2020   Procedure: RIGHT/LEFT HEART CATH AND CORONARY ANGIOGRAPHY;  Surgeon: Yvonne Kendall, MD;  Location: [facility] INVASIVE CV [facility];  Service: Cardiovascular;  Laterality: N/A;   TEE WITHOUT CARDIOVERSION N/A 11/03/2020   Procedure: TRANSESOPHAGEAL ECHOCARDIOGRAM (TEE);  Surgeon: Loreli Slot, MD;  Location: [facility] OR;  Service: Open Heart Surgery;  Laterality: N/A;   THORACIC AORTIC ANEURYSM REPAIR N/A 11/03/2020   Procedure: THORACIC ASCENDING ANEURYSM REPAIR (AAA) USING HEMASHIELD PLATINUM GRAFT;  Surgeon: Loreli Slot, MD;  Location: [facility] OR;  Service: Open Heart Surgery;  Laterality: N/A;  WITH CIRC ARREST    Allergies  Allergies  Allergen Reactions   Amoxicillin Other (See Comments)    Bad taste in mouth & tongue gets fuzzy feeling   Statins     Oral problems and bad taste Fatigue    History of Present Illness      82 y.o. y/o male with the above past medical history including thoracic aortic aneurysm, postoperative atrial fibrillation, hypertension, hyperlipidemia, statin intolerance, nonischemic cardiomyopathy, HFrEF, bronchiectasis status post left lung resection, memory loss, and prostate cancer. In mid 2022, in the setting of an elevated PSA and prior history of prostate cancer, he underwent PET/CT, which showed recurrence of prostate cancer and incidentally showed an  ascending thoracic aortic aneurysm. Follow-up CTA of the chest showed a 58 mm ascending thoracic aortic aneurysm. He was seen by CT surgery and underwent echocardiography which showed an EF of 45% with global hypokinesis and grade 1 diastolic dysfunction. He was experiencing dyspnea on exertion and was referred to Dr. Okey Dupre, and subsequently underwent right and left heart diagnostic catheterization in October 2022, revealing normal  coronary arteries, filling pressures, and cardiac output/index. He subsequently underwent ascending aortic and proximal arch aneurysm repair with a 32 mm Hemashield graft on November 03, 2020. Postoperative course was complicated by atrial fibrillation requiring initiation of amiodarone therapy with subsequent conversion to sinus rhythm. Follow-up echo postoperatively showed EF 35 to 40% with global hypokinesis, trivial pericardial effusion, and mild aortic root dilatation at 39 mm.  Most recent echo in February 2024 at [facility], showed EF of 45 to 50% with normal RV size and function, mild AI and MR.  In 07/2022, he was seen in clinic w/ worsening dyspnea and occasional chest pain.  The above-mentioned echo through [facility] was not available for review at that time and thus repeat echo was recommended.  Unfortunately, he presented to the emergency department through [facility] in August with shortness of breath and elevated BNP.  He was placed on Lasix, and subsequently followed up in our [place] office with improvement in symptoms.       Home Medications    Current Outpatient Medications  Medication Sig Dispense Refill   aspirin EC 81 MG tablet Take 1 tablet (81 mg total) by mouth daily. Swallow whole. 90 tablet 0   B Complex Vitamins (B COMPLEX PO) Place 1 mL under the tongue daily.     carvedilol (COREG) 6.25 MG tablet TAKE ONE TABLET BY MOUTH TWICE A DAY 180 tablet 3   donepezil (ARICEPT) 10 MG tablet Take 10 mg by mouth daily.     ezetimibe (ZETIA) 10 MG tablet Take 1 tablet (10 mg total) by mouth daily. 90 tablet 1   GEMTESA 75 MG TABS Take 1 tablet by mouth daily.     losartan (COZAAR) 50 MG tablet Take 50 mg by mouth daily.     nitrofurantoin, macrocrystal-monohydrate, (MACROBID) 100 MG capsule Take 100 mg by mouth 2 (two) times daily.     Pediatric Multiple Vitamins (FLINTSTONES MULTIVITAMIN PO) Take 1 tablet by mouth daily.     phenazopyridine (PYRIDIUM) 200 MG tablet Take  200 mg by mouth 3 (three) times daily as needed.     Polyethyl Glycol-Propyl Glycol (SYSTANE OP) Place 1 drop into both eyes daily as needed (dry eyes).     polyethylene glycol (MIRALAX / GLYCOLAX) 17 g packet Take 17 g by mouth daily as needed for mild constipation.     silodosin (RAPAFLO) 8 MG CAPS capsule Take 8 mg by mouth at bedtime.     XTANDI 40 MG tablet Take 80 mg by mouth 2 (two) times daily.     No current facility-administered medications for this visit.     Review of Systems    ***.  All other systems reviewed and are otherwise negative except as noted above.    Physical Exam    VS:  There were no vitals taken for this visit. , BMI There is no height or weight on file to calculate BMI.     GEN: Well nourished, well developed, in no acute distress. HEENT: normal. Neck: Supple, no JVD, carotid bruits, or masses. Cardiac: RRR, no murmurs, rubs, or gallops. No  clubbing, cyanosis, edema.  Radials 2+/PT 2+ and equal bilaterally.  Respiratory:  Respirations regular and unlabored, clear to auscultation bilaterally. GI: Soft, nontender, nondistended, BS + x 4. MS: no deformity or atrophy. Skin: warm and dry, no rash. Neuro:  Strength and sensation are intact. Psych: Normal affect.  Accessory Clinical Findings    ECG personally reviewed by me today -    *** - no acute changes.  Lab Results  Component Value Date   WBC 8.7 11/08/2020   HGB 9.0 (L) 11/08/2020   HCT 26.2 (L) 11/08/2020   MCV 92.3 11/08/2020   PLT 154 11/08/2020   Lab Results  Component Value Date   CREATININE 0.62 06/26/2021   BUN 22 06/26/2021   NA 141 06/26/2021   K 3.9 06/26/2021   CL 105 06/26/2021   CO2 32 06/26/2021   Lab Results  Component Value Date   ALT 25 10/30/2020   AST 27 10/30/2020   ALKPHOS 70 10/30/2020   BILITOT 0.9 10/30/2020   Lab Results  Component Value Date   CHOL 163 10/20/2020   HDL 58 10/20/2020   LDLCALC 96 10/20/2020   TRIG 44 10/20/2020   CHOLHDL 2.8  10/20/2020    Lab Results  Component Value Date   HGBA1C 5.8 (H) 10/30/2020    Assessment & Plan    1.  ***  Nicolasa Ducking, NP 10/15/2022, 7:44 AM

## 2022-10-21 ENCOUNTER — Encounter: Payer: Self-pay | Admitting: Thoracic Surgery (Cardiothoracic Vascular Surgery)

## 2022-11-05 ENCOUNTER — Ambulatory Visit
Admission: RE | Admit: 2022-11-05 | Discharge: 2022-11-05 | Disposition: A | Discharge status: Home / Self Care | Payer: Medicare Other | Source: Ambulatory Visit | Attending: Thoracic Surgery (Cardiothoracic Vascular Surgery) | Admitting: Thoracic Surgery (Cardiothoracic Vascular Surgery)

## 2022-11-05 DIAGNOSIS — I7121 Aneurysm of the ascending aorta, without rupture: Secondary | ICD-10-CM

## 2022-11-05 MED ORDER — IOPAMIDOL (ISOVUE-370) INJECTION 76%
100.0000 mL | Freq: Once | INTRAVENOUS | Status: AC | PRN
  Administered 2022-11-05: 85 mL via INTRAVENOUS

## 2022-11-09 ENCOUNTER — Ambulatory Visit: Payer: Medicare Other | Admitting: Thoracic Surgery (Cardiothoracic Vascular Surgery)

## 2022-11-09 ENCOUNTER — Encounter: Payer: Self-pay | Admitting: Thoracic Surgery (Cardiothoracic Vascular Surgery)

## 2022-11-09 VITALS — BP 165/72 | HR 56 | Resp 20 | Ht 74.0 in | Wt 149.0 lb

## 2022-11-09 DIAGNOSIS — Z8679 Personal history of other diseases of the circulatory system: Secondary | ICD-10-CM

## 2022-11-09 DIAGNOSIS — Z9889 Other specified postprocedural states: Secondary | ICD-10-CM

## 2022-11-09 DIAGNOSIS — I7121 Aneurysm of the ascending aorta, without rupture: Secondary | ICD-10-CM | POA: Diagnosis not present

## 2022-11-09 NOTE — Progress Notes (Signed)
[address]       Justin Contreras [address]             [phone]     HPI: Justin Contreras returns for a scheduled follow-up visit after undergoing replacement of his ascending aorta and arch in 2022.  Justin Contreras is an 82 year old man with a history of prostate cancer, hypertension, hyperlipidemia, ascending and arch aortic aneurysm, and Alzheimer's dementia.  Found to have an ascending and proximal arch aneurysm on a PET/CT.  He underwent replacement on 11/03/2020.  Had atrial fibrillation postoperatively but converted to sinus rhythm with medication.  He is accompanied by his wife.  He has significant dementia and is very unclear on details.  His wife fills in some of the history but is a poor historian as well.  He complains of losing weight even though his appetite is good.  Has noticed some "bumps" along his sternal incision.  Complains of shortness of breath with exertion.  No chest pain.  Past Medical History:  Diagnosis Date   Alzheimer disease (HCC)    Aortic regurgitation 2024   Bronchiectasis (HCC)    Chronic HFrEF (heart failure with reduced ejection fraction) (HCC)    Hyperlipidemia    Hypertension    NICM (nonischemic cardiomyopathy) (HCC)    a. 09/2020 Echo: EF 45%, glob HK, GrI DD; b. 10/2020 Cath: Nl cors. Nl R heart filling pressures. Nl CO/CI; c. 10/2020 Echo: EF 35-40%, glob HK; d. 06/2021 Echo: EF 35-40%, glob HK; e. 02/2022 Echo ([facility]): EF 45-50%, abnl paradoxical septal motion - lbbb, nl RV size/fxn, mildly dil LA, mild AI/MR.   PAF (paroxysmal atrial fibrillation) (HCC) 2022   a. post-op Afib after Asc Ao aneurysm repair.   Pneumonia    Prostate cancer [facility])    Thoracic aortic aneurysm (HCC)    a. 09/2020 CTA Chest: 58mm Asc TAA; b. 10/2020 s/p 32mm Hemashield graft w/ separate anastomoses to innominate and L common carotid; c. 10/2020 Echo: Mild Ao root dil @ 39mm; d. 06/2021 Echo: Ao root 40mm.    Current Outpatient Medications  Medication Sig  Dispense Refill   B Complex Vitamins (B COMPLEX PO) Place 1 mL under the tongue daily.     carvedilol (COREG) 6.25 MG tablet TAKE ONE TABLET BY MOUTH TWICE A DAY 180 tablet 3   donepezil (ARICEPT) 10 MG tablet Take 10 mg by mouth daily.     ezetimibe (ZETIA) 10 MG tablet Take 1 tablet (10 mg total) by mouth daily. 90 tablet 1   GEMTESA 75 MG TABS Take 1 tablet by mouth daily.     losartan (COZAAR) 50 MG tablet Take 50 mg by mouth daily.     Pediatric Multiple Vitamins (FLINTSTONES MULTIVITAMIN PO) Take 1 tablet by mouth daily.     Polyethyl Glycol-Propyl Glycol (SYSTANE OP) Place 1 drop into both eyes daily as needed (dry eyes).     polyethylene glycol (MIRALAX / GLYCOLAX) 17 g packet Take 17 g by mouth daily as needed for mild constipation.     silodosin (RAPAFLO) 8 MG CAPS capsule Take 8 mg by mouth at bedtime.     XTANDI 40 MG tablet Take 80 mg by mouth 2 (two) times daily.     No current facility-administered medications for this visit.    Physical Exam BP (!) 165/72 (BP Location: Right Arm, Patient Position: Sitting, Cuff Size: Normal)   Pulse (!) 56   Resp 20   Ht 6\' 2"  (  1.88 m)   Wt 149 lb (67.6 kg)   SpO2 96% Comment: RA  BMI 19.13 kg/m  Elderly frail-appearing 82 year old man in no acute distress Alert, and very slow with responses.  Oriented x 3. Cachectic Cardiac regular rate and rhythm with a 2/6 systolic murmur Lungs clear bilaterally Sternum stable and well-healed, wires easily palpable through skin  Diagnostic Tests: CT ANGIOGRAPHY CHEST WITH CONTRAST   TECHNIQUE: Multidetector CT imaging of the chest was performed using the standard protocol during bolus administration of intravenous contrast. Multiplanar CT image reconstructions and MIPs were obtained to evaluate the vascular anatomy.   RADIATION DOSE REDUCTION: This exam was performed according to the departmental dose-optimization program which includes automated exposure control, adjustment of the mA  and/or kV according to patient size and/or use of iterative reconstruction technique.   CONTRAST:  85 mL ISOVUE-370 IOPAMIDOL (ISOVUE-370) INJECTION 76%   COMPARISON:  Prior CT scan of the chest 10/27/2021   FINDINGS: Cardiovascular: Surgical changes of open repair of ascending thoracic aortic aneurysm with a tube graft extending from the aortic root into the thoracic arch. The right brachiocephalic and left subclavian arteries are reimplanted. No evidence of complication at the proximal or distal anastomosis. The aortic root is normal in caliber. Mild atherosclerotic calcifications present along the aorta and coronary arteries. The heart is mildly enlarged with dilatation of the left ventricle. No pericardial effusion.   Mediastinum/Nodes: No enlarged mediastinal, hilar, or axillary lymph nodes. Thyroid gland, trachea, and esophagus demonstrate no significant findings.   Lungs/Pleura: Stable multifocal partially calcified subpleural plaques throughout the right lung. The left lung is clear. No acute cardiopulmonary process.   Upper Abdomen: Significant relative hypoattenuation of the right kidney relative to the left. Suspect obstructed uropathy.   Musculoskeletal: No chest wall abnormality. No acute or significant osseous findings.   Review of the MIP images confirms the above findings.   IMPRESSION: 1. Stable postoperative changes of open repair of ascending thoracic aortic aneurysm without evidence of complication. 2. Significant relative hypoattenuation of the right renal parenchyma relative to the left. Suspect obstructive uropathy which is new compared to prior imaging from February of 2024. Given history of prostate cancer, this could reflect new distal ureteral obstruction. Consider further evaluation with CT scan of the abdomen and pelvis. 3. Aortic and coronary artery atherosclerotic plaque. 4. Stable multifocal right-sided calcified subpleural plaques.    Aortic Atherosclerosis (ICD10-I70.0).   These results will be called to the ordering clinician or representative by the Radiologist Assistant, and communication documented in the PACS or [employer].     Electronically Signed   By: Malachy Moan M.D.   On: 11/06/2022 06:17 I personally reviewed the CT images.  Postoperative changes from ascending aorta/arch repair.  No aneurysms or pseudoaneurysms.  Aortic and coronary atherosclerosis.  Impression: Justin Contreras is an 82 year old man with a history of prostate cancer, hypertension, hyperlipidemia, ascending and arch aortic aneurysm, and Alzheimer's dementia.    Ascending and arch thoracic aortic aneurysm-repair 2 years ago.  No issues on CT.  No indication for further follow-up.  Hypertension-he is wife both are confused with medications.  She reports checking it at home and it typically runs between 145 and 165 systolic.  Recommended they follow-up with his primary Dr. Alben Spittle to see if any medication adjustments are necessary.  Recommended that she keep a written record of his blood pressures before that visit.  Concern for obstructive uropathy on CT.  Previously patient of Dr. Retta Diones, now followed by Dr.  Eskridge.  I went over the CT findings with Mr. Mrs. Fipps multiple times.  Strongly recommended that he follow-up with Dr. Mena Goes in the near future to see if any additional workup is indicated.  Plan: Follow-up with Dr. Mena Goes regarding renal findings on CT Follow-up with Dr. Alben Spittle regarding blood pressure management and shortness of breath with exertion. I will be happy to see Mr. Chernesky back if I can be of any help with his care but he would not be a candidate for any cardiac surgical intervention.  Loreli Slot, MD [facility] [phone]

## 2022-11-26 ENCOUNTER — Encounter (HOSPITAL_COMMUNITY): Payer: Self-pay | Admitting: Urology

## 2022-11-26 ENCOUNTER — Other Ambulatory Visit: Payer: Self-pay | Admitting: Urology

## 2022-11-26 ENCOUNTER — Telehealth: Payer: Self-pay | Admitting: Internal Medicine

## 2022-11-26 NOTE — Telephone Encounter (Signed)
   Name: Justin Contreras  DOB: [birth year]  MRN: [id number]  Primary Cardiologist: Yvonne Kendall, MD  Chart reviewed as part of pre-operative protocol coverage. Because of [name] past medical history and time since last visit, he will require a follow-up in-office visit in order to better assess preoperative cardiovascular risk. He is overdue for office follow-up.   Pre-op covering staff: - Please schedule appointment and call patient to inform them. If patient already had an upcoming appointment within acceptable timeframe, please add "pre-op clearance" to the appointment notes so provider is aware. - Please contact requesting surgeon's office via preferred method (i.e, phone, fax) to inform them of need for appointment prior to surgery.   Denyce Lashawn, NP  11/26/2022, 3:44 PM

## 2022-11-26 NOTE — Telephone Encounter (Signed)
   Pre-operative Risk Assessment    Patient Name: Justin Contreras  DOB: 1940-03-09 MRN: [id number]      Request for Surgical Clearance    Procedure:   resection of bladder tumor   Date of Surgery:  Clearance 11/30/22                                 Surgeon:  Dr. Jerilee Field  Surgeon's Group or Practice Name:  [facility] Phone number:  [phone] Fax number:  [phone]   Type of Clearance Requested:   - Medical    Type of Anesthesia:  General    Additional requests/questions:      SignedFilomena Jungling   11/26/2022, 3:37 PM

## 2022-11-28 NOTE — H&P (Signed)
Office Visit Report     11/26/2022   --------------------------------------------------------------------------------   Justin Contreras  MRN: [id number]  DOB: September 25, 1940, 82 year old Male  SSN: -**[phone]   PRIMARY CARE:  Justin Dolin, MD  PRIMARY CARE FAX:  [phone]  REFERRING:  Justin Keel, MD  PROVIDER:  Jerilee Contreras, M.D.  LOCATION:  [facility] [phone]     --------------------------------------------------------------------------------   CC/HPI: F/u -   1) PCa - diagnosed in 1999, at the time he had a left sided prostate nodule. 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 His PSA at the time of diagnosis was 3.6. He underwent brachytherapy in March, 2000 by Justin Contreras. He had short-term LUTS and radiation proctitis following this. His PSA nadir was under 0.2. He underwent repeat ultrasound and biopsy of his prostate in 2013. Prostatic volume was 20 cc. All biopsies were negative, including seminal vesicle biopsies. Most of the biopsy showed radiation atypia.   His PSA was relatively stable to 5.29 in 2020. He underwent PET scan 08/22 with only prostate activity noted, primarily anteriorly, but his PSA rose to 55. He started ADT. PSA declined to 0.9 in 2023 but rose to 1.5 in 2024. He started [place]. He had Feb 2024 bone scan at [facility]. This was + right iliac, acetabulum and ischium. A Apr 2024 bone marrow biopsy at [facility] was positive for prostate cancer. He saw heme onc - Justin Contreras - and they did not recommend chemo. They will consider lutetium PSMA in future. He started [place] His Jul 2024 PSA 0.65.   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 Tamuslosin, vesicare and Gemtesa at basline PVR's range. Cysto, foley placement in 2022 during thoracic anuerysm repair. Prior optilume 2022. Urinary retention Aug 2024. Passed VT. His IPSS was 18, QoL 5.   3 - Gross Hematura / Radiation Cystitis - occasional hematuria. CT 2024 w/o upper tract lesions.   Today, seen for for the above and new issues gross hematuria, bladder mass and bilateral hydronephrosis. He needs cystoscopy and complex medical management. The patient continues to have frequency urgency weak stream. He is incontinent and voiding in a diaper. He does not want to drink fluids because of his urinary symptoms and this concerns his family. He is here with his wife and son.   He saw Justin Contreras at the end of October 2024 with a weak stream and gross hematuria. His postvoid was 25 but UA showed 20-40 red cells. Culture was negative. About the same time he underwent an October 2024 CT angio of the chest to monitoring an aneurysm which showed hypoperfusion and hydronephrosis of the right kidney. He underwent a November 2024 CT scan which showed a new 5.5 cm bladder mass around the trigone causing moderate right and mild left hydronephrosis. He had a new 15 mm right external iliac node and increase in his sclerotic bone lesions in the pelvis.  Cystoscopy today with moderate contracture of the prostatic urethra and bladder neck which required a catheter passage before the scope would passed. Papillary tumor along the trigone and bladder neck consistent with high-grade prostate cancer and/or urothelial cancer.   October 2024 creatinine 1.0 and then recheck 1.29.  October 2024 PSA increased to 1.95 and on recheck 2.39 (a W FB).    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     ALLERGIES: Amoxicillin TABS - Anemia Statins     MEDICATIONS: Aspirin  Cephalexin 250 mg  capsule 1 capsule PO Q HS  Oxybutynin Chloride 5 mg tablet 1 tablet PO Daily  Pyridium 200 mg tablet 1 tablet PO TID PRN Take for pain/burning with voiding.  Amiodarone Hcl 200 mg tablet 1 tablet PO Daily  Carvedilol 3.125 mg tablet  Cephalexin 250 mg capsule 1 capsule PO Daily Verbal auth given to pharmacy to switch from tablets to capsules  Donepezil Hcl 10 mg tablet 1 tablet PO Daily  Losartan Potassium 25 mg tablet  Memantine Hcl  Polyethylene Glycol  Silodosin 4 mg capsule 1 capsule PO Q HS  Tylenol PRN  Xtandi 40 mg tablet 4 tablet PO Daily     GU PSH: Cysto Dilate Stricture (M or F) - 07/09/2021 Cystoscopy - 06/26/2021 TRANSPERI NEEDLE PLACE, PROS - 2008 Xgeva - 11/24/2022, 10/27/2022, 09/24/2022       PSH Notes: Arthroscopy Knee Left, Mohs Micrographic Surgery Nose, Surgery Prostate Transperineal Placement Of Needles, Lung Lobectomy   NON-GU PSH: Knee Arthroscopy; Dx - 2014 Partial Remove Lung - 2008 Visit Complexity (formerly GPC1X) - 11/10/2022, 10/20/2022, 09/24/2022, 07/26/2022     GU PMH: Prostate Cancer - 11/24/2022, - 11/18/2022, - 10/27/2022, PSA and T sent. Keep f/u with Justin Contreras. 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 , - 10/20/2022, - 09/24/2022, - 09/07/2022 Dysuria - 11/10/2022 Gross hematuria - 11/10/2022, recent imaging and cysto normal plan to repeat in 3 mo. , - 10/20/2022, - 09/07/2022, - 08/25/2022 Bulbar urethral stricture - 10/20/2022, - 09/24/2022, - 09/07/2022, - 07/02/2022 Urinary Retention - 09/24/2022      PMH Notes:  2008-12-16 10:01:08 - Note: Visit For: Exam Following Radiotherapy   NON-GU PMH: Other skin changes, disc nature r/b of oxyb or megace for hot flash. No constipation. will check to see if he's taking vesicare and if not trial a low dose oxyb. If he is will add megace. - 10/20/2022    FAMILY HISTORY: Bone  Cancer - Father Death - Mother, Father Family Health Status Number - Runs In Family Parkinson's Disease - Mother Prostate Cancer - Father   SOCIAL HISTORY: Marital Status: Married Preferred Language: English; Ethnicity: Not Hispanic Or Latino; Race: White Does not use smokeless tobacco. Does not use drugs. Has not had a blood transfusion. Patient's occupation is/was Retired.    REVIEW OF SYSTEMS:    GU Review Male:   hematuria.  Patient reports hard to postpone urination, leakage of urine, and penile pain. Patient denies frequent urination, burning/ pain with urination, get up at night to urinate, stream starts and stops, trouble starting your stream, have to strain to urinate , and erection problems.  Gastrointestinal (Upper):   Patient denies nausea, vomiting, and indigestion/ heartburn.  Gastrointestinal (Lower):   Patient denies diarrhea and constipation.  Constitutional:   Patient reports fatigue. Patient denies fever, night sweats, and weight  loss.  Skin:   Patient denies skin rash/ lesion and itching.  Eyes:   Patient denies blurred vision and double vision.  Ears/ Nose/ Throat:   Patient denies sinus problems and sore throat.  Hematologic/Lymphatic:   Patient denies swollen glands and easy bruising.  Cardiovascular:   Patient denies leg swelling and chest pains.  Respiratory:   Patient denies cough and shortness of breath.  Endocrine:   Patient denies excessive thirst.  Musculoskeletal:   Patient denies back pain and joint pain.  Neurological:   Patient denies headaches and dizziness.  Psychologic:   Patient denies depression and anxiety.   VITAL SIGNS: None   GU PHYSICAL EXAMINATION:    Scrotum: No lesions. No edema. No cysts. No warts.  Urethral Meatus: Normal size. No lesion, no wart, no discharge, no polyp. Normal location.  Penis: Circumcised, no warts, no cracks. No dorsal Peyronie's plaques, no left corporal Peyronie's plaques, no right corporal Peyronie's plaques,  no scarring, no warts. No balanitis, no meatal stenosis.   MULTI-SYSTEM PHYSICAL EXAMINATION:    Constitutional: Well-nourished. No physical deformities. Normally developed. Good grooming.  Neck: Neck symmetrical, not swollen. Normal tracheal position.  Respiratory: No labored breathing, no use of accessory muscles.   Cardiovascular: Normal temperature, normal extremity pulses, no swelling, no varicosities.  Skin: No paleness, no jaundice, no cyanosis. No lesion, no ulcer, no rash.  Neurologic / Psychiatric: Oriented to time, oriented to place, oriented to person. No depression, no anxiety, no agitation.  Gastrointestinal: No mass, no tenderness, no rigidity, non obese abdomen.     Complexity of Data:  Lab Test Review:   PSA, BUN/Creatinine  Records Review:   Previous Doctor Records  X-Ray Review: C.T. Abdomen/Pelvis: Reviewed Films. Discussed With Patient. 2024    10/20/22 07/22/22 04/21/22 02/08/22 12/09/21 06/26/21 04/08/21 12/15/20  PSA  Total PSA 1.95 ng/mL 0.65 ng/ml 0.56 ng/ml 1.50 ng/mL 1.20 ng/mL 0.90 ng/mL 1.11 ng/mL 2.34 ng/mL    10/20/22 02/08/22 12/09/21 06/26/21 04/08/21 12/15/20 01/19/08 02/16/06  Hormones  Testosterone, Total <10 ng/dL <41 ng/dL <66 ng/dL <06 ng/dL <30 ng/dL <16 ng/dL 010.9  3.23    Notes:                     Justin Contreras   PROCEDURES:         Flexible Cystoscopy - 52000  Risks, benefits, and some of the potential complications of the procedure were discussed with the patient. All questions were answered. Informed consent was obtained. Antibiotic prophylaxis was given -- Cephalexin. Sterile technique and intraurethral analgesia were used.  Meatus:  Normal size. Normal location. Normal condition.  Urethra:  No strictures.  External Sphincter:  Normal.  Verumontanum:  Normal.  Prostate:  Obstructing. Mild hyperplasia. He has a very tight prostate. I could not initially pass the scope. I passed a 74 [place] coud with some resistance. Urine was maroon and  irrigated to pink very quickly. The catheter was removed and then I was able to advance the scope with some resistance.  Bladder Neck:  Severe contracture  Ureteral Orifices:  Not identified-there is papillary tumor along the bladder neck and trigone.  Bladder:  Moderate trabeculation.       The lower urinary tract was carefully examined. The procedure was well-tolerated and without complications. Antibiotic instructions were given. Instructions were given to call the office immediately for bloody urine, difficulty urinating, painful urination, fever, chills, nausea, vomiting or other illness. The patient stated that he understood these instructions and  would comply with them.         Visit Complexity - G2211 Chronic management   ASSESSMENT:      ICD-10 Details  1 GU:   Prostate Cancer - C61 Chronic, Stable - Rising PSA. He had a PET scan at [facility] November 2024 but neither his son nor I can access it. They will check with Dr. Alinda Money on the results but they are likely similar to the CT here.  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 Discussed no intervention which can be life-threatening.   Labs sent and may need referral to emergency for admission.  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 This may be invasion of high-grade prostate cancer, but we did discuss the urothelium can become malignant years after radiation and we may be dealing with a aggressive second primary from the radiation. We discussed the nature risk benefits and alternatives to TURBT and ureteral stent placement. discussed stents may fail or not be able to be placed.  5   Gross hematuria - R31.0 Chronic, Worsening - I flushed him to clear quickly but they wanted to go to a catheter  because of the symptomatic voiding. if bothersome the catheter could be removed.   PLAN:           Orders Labs Hemoglobin and Hematocrit, BUN/Creatinine          Schedule Return Visit/Planned Activity: ASAP - Schedule Surgery          Document Letter(s):  Created for Patient: Clinical Summary         Notes:   CC: Dr. Alben Spittle, Justin Contreras (Justin Contreras)        Next Appointment:      Next Appointment: 12/22/2022 03:00 PM    Appointment Type: Drug Therapy NO URINE    Location: [facility] [phone]    Provider: Schedule In-Office Dispensing    Reason for Visit: XGEVA 1 of 3/Xtandi pickup      * Signed by Justin Contreras, M.D. on 11/26/22 at 1:14 PM (EST)*     ADD; he was on cephalexin when seen in the office. 11/26/2022 hgb 12.6, cr 1.2.

## 2022-11-29 ENCOUNTER — Other Ambulatory Visit: Payer: Self-pay

## 2022-11-29 ENCOUNTER — Encounter (HOSPITAL_COMMUNITY)
Admission: RE | Admit: 2022-11-29 | Discharge: 2022-11-29 | Disposition: A | Discharge status: Home / Self Care | Payer: Medicare Other | Source: Ambulatory Visit | Attending: Urology | Admitting: Urology

## 2022-11-29 ENCOUNTER — Encounter (HOSPITAL_COMMUNITY): Payer: Self-pay

## 2022-11-29 ENCOUNTER — Other Ambulatory Visit: Payer: Self-pay | Admitting: Urology

## 2022-11-29 VITALS — BP 159/71 | HR 66 | Temp 98.3°F | Resp 16 | Ht 74.0 in | Wt 147.7 lb

## 2022-11-29 DIAGNOSIS — Z01818 Encounter for other preprocedural examination: Secondary | ICD-10-CM | POA: Diagnosis present

## 2022-11-29 HISTORY — DX: Anxiety disorder, unspecified: F41.9

## 2022-11-29 HISTORY — DX: Depression, unspecified: F32.A

## 2022-11-29 HISTORY — DX: Dementia in other diseases classified elsewhere, unspecified severity, without behavioral disturbance, psychotic disturbance, mood disturbance, and anxiety: F02.80

## 2022-11-29 LAB — BASIC METABOLIC PANEL
Anion gap: 9 (ref 5–15)
BUN: 27 mg/dL — ABNORMAL HIGH (ref 8–23)
CO2: 27 mmol/L (ref 22–32)
Calcium: 8.2 mg/dL — ABNORMAL LOW (ref 8.9–10.3)
Chloride: 107 mmol/L (ref 98–111)
Creatinine, Ser: 1.47 mg/dL — ABNORMAL HIGH (ref 0.61–1.24)
GFR, Estimated: 48 mL/min — ABNORMAL LOW (ref >60)
Glucose, Bld: 114 mg/dL — ABNORMAL HIGH (ref 70–99)
Potassium: 4.3 mmol/L (ref 3.5–5.1)
Sodium: 143 mmol/L (ref 135–145)

## 2022-11-29 LAB — CBC
HCT: 34 % — ABNORMAL LOW (ref 39.0–52.0)
Hemoglobin: 11 g/dL — ABNORMAL LOW (ref 13.0–17.0)
MCH: 31.4 pg (ref 26.0–34.0)
MCHC: 32.4 g/dL (ref 30.0–36.0)
MCV: 97.1 fL (ref 80.0–100.0)
Platelets: 143 10*3/uL — ABNORMAL LOW (ref 150–400)
RBC: 3.5 MIL/uL — ABNORMAL LOW (ref 4.22–5.81)
RDW: 14.5 % (ref 11.5–15.5)
WBC: 4 10*3/uL (ref 4.0–10.5)
nRBC: 0 % (ref 0.0–0.2)

## 2022-11-29 NOTE — Patient Instructions (Addendum)
SURGICAL WAITING ROOM VISITATION  Patients having surgery or a procedure may have no more than 2 support people in the waiting area - these visitors may rotate.    Children under the age of 71 must have an adult with them who is not the patient.  Due to an increase in RSV and influenza rates and associated hospitalizations, children ages 61 and under may not visit patients in [facility] hospitals.  If the patient needs to stay at the hospital during part of their recovery, the visitor guidelines for inpatient rooms apply. Pre-op nurse will coordinate an appropriate time for 1 support person to accompany patient in pre-op.  This support person may not rotate.    Please refer to the [facility] website for the visitor guidelines for Inpatients (after your surgery is over and you are in a regular room).       Your procedure is scheduled on:  11/30/2022    Report to [facility] Main Entrance    Report to admitting at   100 pm   Call this number if you have problems the morning of surgery [phone]   Do not eat food  or drink liquids :After Midnight.                                          If you have questions, please contact your surgeon's office.       Oral Hygiene is also important to reduce your risk of infection.                                    Remember - BRUSH YOUR TEETH THE MORNING OF SURGERY WITH YOUR REGULAR TOOTHPASTE  DENTURES WILL BE REMOVED PRIOR TO SURGERY PLEASE DO NOT APPLY "Poly grip" OR ADHESIVES!!!   Do NOT smoke after Midnight   Stop all vitamins and herbal supplements 7 days before surgery.   Take these medicines the morning of surgery with A SIP OF WATER:  coreg, aricept., gemtesa, xtandi   DO NOT TAKE ANY ORAL DIABETIC MEDICATIONS DAY OF YOUR SURGERY  Bring CPAP mask and tubing day of surgery.                              You may not have any metal on your body including hair pins, jewelry, and body piercing             Do not  wear make-up, lotions, powders, perfumes/cologne, or deodorant  Do not wear nail polish including gel and S&S, artificial/acrylic nails, or any other type of covering on natural nails including finger and toenails. If you have artificial nails, gel coating, etc. that needs to be removed by a nail salon please have this removed prior to surgery or surgery may need to be canceled/ delayed if the surgeon/ anesthesia feels like they are unable to be safely monitored.   Do not shave  48 hours prior to surgery.               Men may shave face and neck.   Do not bring valuables to the hospital. [facility] IS NOT             RESPONSIBLE   FOR VALUABLES.   Contacts, glasses, dentures  or bridgework may not be worn into surgery.   Bring small overnight bag day of surgery.   DO NOT BRING YOUR HOME MEDICATIONS TO THE HOSPITAL. PHARMACY WILL DISPENSE MEDICATIONS LISTED ON YOUR MEDICATION LIST TO YOU DURING YOUR ADMISSION IN THE HOSPITAL!    Patients discharged on the day of surgery will not be allowed to drive home.  Someone NEEDS to stay with you for the first 24 hours after anesthesia.   Special Instructions: Bring a copy of your healthcare power of attorney and living will documents the day of surgery if you haven't scanned them before.              Please read over the following fact sheets you were given: IF YOU HAVE QUESTIONS ABOUT YOUR PRE-OP INSTRUCTIONS PLEASE CALL [phone]   If you received a COVID test during your pre-op visit  it is requested that you wear a mask when out in public, stay away from anyone that may not be feeling well and notify your surgeon if you develop symptoms. If you test positive for Covid or have been in contact with anyone that has tested positive in the last 10 days please notify you surgeon.    [facility] - Preparing for Surgery Before surgery, you can play an important role.  Because skin is not sterile, your skin needs to be as free of germs as possible.   You can reduce the number of germs on your skin by washing with CHG (chlorahexidine gluconate) soap before surgery.  CHG is an antiseptic cleaner which kills germs and bonds with the skin to continue killing germs even after washing. Please DO NOT use if you have an allergy to CHG or antibacterial soaps.  If your skin becomes reddened/irritated stop using the CHG and inform your nurse when you arrive at Short Stay. Do not shave (including legs and underarms) for at least 48 hours prior to the first CHG shower.  You may shave your face/neck. Please follow these instructions carefully:  1.  Shower with CHG Soap the night before surgery and the  morning of Surgery.  2.  If you choose to wash your hair, wash your hair first as usual with your  normal  shampoo.  3.  After you shampoo, rinse your hair and body thoroughly to remove the  shampoo.                           4.  Use CHG as you would any other liquid soap.  You can apply chg directly  to the skin and wash                       Gently with a scrungie or clean washcloth.  5.  Apply the CHG Soap to your body ONLY FROM THE NECK DOWN.   Do not use on face/ open                           Wound or open sores. Avoid contact with eyes, ears mouth and genitals (private parts).                       Wash face,  Genitals (private parts) with your normal soap.             6.  Wash thoroughly, paying special attention to the area where your surgery  will be performed.  7.  Thoroughly rinse your body with warm water from the neck down.  8.  DO NOT shower/wash with your normal soap after using and rinsing off  the CHG Soap.                9.  Pat yourself dry with a clean towel.            10.  Wear clean pajamas.            11.  Place clean sheets on your bed the night of your first shower and do not  sleep with pets. Day of Surgery : Do not apply any lotions/deodorants the morning of surgery.  Please wear clean clothes to the hospital/surgery  center.  FAILURE TO FOLLOW THESE INSTRUCTIONS MAY RESULT IN THE CANCELLATION OF YOUR SURGERY PATIENT SIGNATURE_________________________________  NURSE SIGNATURE__________________________________  ________________________________________________________________________

## 2022-11-29 NOTE — Telephone Encounter (Signed)
Spoke with Pam at [facility] urology and reviewed that he needed appointment before he can be cleared. She states that he is an urgent case and is scheduled for tomorrow. We reviewed schedules and was able to get him in tomorrow to see provider. She verbalized understanding with no further questions.

## 2022-11-29 NOTE — Progress Notes (Addendum)
Anesthesia Review:  PCP: DR Sidonie Dickens LOV 10/07/22.  Cardiologist : DR Cristal Deer End- needs preop per note  To be doen at 0800am by Emmaline Kluver on 11/30/22- am of surgery  Chest x-ray : CT angio chest 11/06/22  EKG : 07/22/22  Echo : 06/26/21  Stress test: Cardiac Cath :  2022  Activity level: cannot do a flgiht of stairs without difficulty  Sleep Study/ CPAP : none  Fasting Blood Sugar :      / Checks Blood Sugar -- times a day:   Blood Thinner/ Instructions /Last Dose: ASA / Instructions/ Last Dose :    11/27/22- In ED for hematuria CBC/Diff and CMP done    PT has mild alzheimer's .  Wife with pt at preop appt.   PT states since ED visit on 11/27/22 "Looks like pure blood in catheter bag.  Has emptied regularly.  Asked Jacobo Forest about redrawing labs.  CBC and BMP done at preop on 11/29/22.  Positive for covid- 08/2022 per wife

## 2022-11-29 NOTE — Telephone Encounter (Signed)
Office calling to f/u on [facility] being that pt is schedule for procedure tomorrow at 3:15 pm. Please advise

## 2022-11-29 NOTE — Telephone Encounter (Signed)
I s/w  Pam at [facility] for clarification as to the notes that I was reading. Pam at [facility] clarified that yes the pt has his URGENT surgery tomorrow at 3:15 and that she s/w Pam RN in our [place] office a few minutes ago and was informed that she has scheduled the pt an appt 11/30/22 8 am. Pam at [facility] stated the [facility], RN with [facility] said she will call the pt. Pam at [facility] states she will call the pt as well.   I assured Pam with Dr. Mena Goes office I will send notes from today as FYI.

## 2022-11-29 NOTE — Telephone Encounter (Signed)
Spoke with patients spouse and reviewed appointment scheduled to come in and see [place] tomorrow at 8 am. She verbalized understanding with no further questions.

## 2022-11-29 NOTE — Progress Notes (Unsigned)
Cardiology Clinic Note   Date: 11/30/2022 ID: SOROUSH DAI, DOB November 21, 1940, MRN [id number]  Primary Cardiologist:  Yvonne Kendall, MD  Patient Profile    Justin Contreras is a 82 y.o. male who presents to the clinic today for pre-op evaluation.     Past medical history significant for: Chronic HFrEF/nonischemic cardiomyopathy. R/LHC 10/20/2020: No angiographically significant CAD.  Normal left heart, right heart, and pulmonary pressures.  Normal Fick cardiac output/index. Echo 06/26/2021: EF 35 to 40%.  Global hypokinesis.  Moderate LVH.  Grade I DD.  Normal RV size/function.  Normal PA pressure.  Moderate LAE.  Mild MR/AI.  Aortic valve sclerosis without stenosis.  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 PAF. 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 Hyperlipidemia. Lipid panel 10/07/2022: LDL 107, HDL 67, TG 80, total 194. MRSA CVA. COPD.   Prostate cancer with bone mets. Bronchiectasis. S/p left lung resection. Alzheimer disease.  In summary, patient underwent PET/CT in mid 2022 in the setting of elevated PSA and prior history of prostate cancer which showed recurrence of prostate cancer and incidental finding of ascending thoracic aortic aneurysm.  Follow-up CTA chest showed 58 mm ascending thoracic aortic aneurysm.  He was seen by CT surgery and underwent echo which showed EF 45% with global hypokinesis and Grade I DD.  He was experiencing dyspnea with exertion and was referred to Dr. Okey Dupre.  He underwent R/LHC as above.  He underwent ascending aortic and proximal arch aneurysm repair (details above).  His postoperative course was complicated by A-fib requiring initiation of amiodarone with subsequent conversion to sinus rhythm.  Postop echo showed EF 35 to 40% with global hypokinesis, trivial pericardial effusion, and mild aortic root dilatation 39 mm.  July 2024 he was seen in follow-up and complained of increased DOE while  performing yard work.  He also noted occasional chest discomfort while outside in the heat that resolves when he returns inside.  Symptoms did not limit his activity.  Plan for repeat echo.     History of Present Illness    Justin Contreras is followed by Dr. Okey Dupre for the above outlined history.  Patient was last seen in the office by Wallis Bamberg, NP on 08/30/2022.  He had been seen in the ED the day prior for chest pain and shortness of breath.  BNP was elevated at 611, troponin negative, chest x-ray negative.  Cardiology was consulted and advised to start low-dose diuretic.  At the time of his visit he describes chronic dyspnea related to the heat and intermittent chest pain that sounded nonanginal.  He reported he felt as though sternotomy wires were poking.  Echo was ordered but does not appear to have been completed.  Discussed the use of AI scribe software for clinical note transcription with the patient, who gave verbal consent to proceed.  The patient is accompanied by his wife and son. He is scheduled for bladder tumor resection this afternoon and presents for preop evaluation. Patient and family are unsure why echo was not completed since last visit. 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 He reports continued sternal pain related to prior sternotomy. No anginal chest pain, pressure or tightness. No palpitations. He denies lower extremity edema, orthopnea or PND. He has continued to experience dyspnea with exertion but it does not prevent him from  completing his normal activities. He is independent with all ADLs. He is able to complete light to moderate household activities. He has stairs in his home and is able to navigate them several times a day with some dyspnea that requires a short rest. He decided to get someone to help in the yard. He complains  of some fatigue requiring more rest than in the past.              ROS: All other systems reviewed and are otherwise negative except as noted in History of Present Illness.  Studies Reviewed    EKG Interpretation Date/Time:  Tuesday November 30 2022 08:07:37 EST Ventricular Rate:  59 PR Interval:  204 QRS Duration:  154 QT Interval:  484 QTC Calculation: 479 R Axis:   -39  Text Interpretation: Sinus bradycardia Left axis deviation Left bundle branch block When compared with ECG of 22-Jul-2022 14:49, Premature atrial complexes are no longer Present Confirmed by Carlos Levering [phone]) on 11/30/2022 8:11:51 AM           Physical Exam    VS:  BP 130/62 (BP Location: Left Arm, Patient Position: Sitting, Cuff Size: Normal)   Pulse (!) 59   Ht 6\' 2"  (1.88 m)   Wt 158 lb (71.7 kg)   SpO2 97%   BMI 20.29 kg/m  , BMI Body mass index is 20.29 kg/m.  GEN: Well nourished, well developed, in no acute distress. Neck: No JVD or carotid bruits. Cardiac:  RRR. No murmurs. No rubs or gallops.   Respiratory:  Respirations regular and unlabored. Clear to auscultation without rales, wheezing or rhonchi. GI: Soft, nontender, nondistended. Extremities: Radials/DP/PT 2+ and equal bilaterally. No clubbing or cyanosis. No edema.  Skin: Warm and dry, no rash. Neuro: Strength intact.  Assessment & Plan     Chronic HFrEF/nonischemic cardiomyopathy [facility] October 2022 showed no angiographically significant CAD and normal heart pressures.  Echo June 2023 showed EF 35 to 40%, global hypokinesis, moderate LVH, Grade I DD.  Patient reports DOE that does not prevent him from completing tasks. He denies lower extremity edema, orthopnea or PND. Euvolemic and well compensated on exam.  -Continue losartan, carvedilol. -Cannot change to ARNI secondary to soft BP. -Not a candidate for SGLT2i secondary to prostate cancer. -Will get repeat echo after surgery.  Ascending thoracic aortic aneurysm S/p  repair October 2022.  Echo June 2023 showed mild dilatation of aortic root 40 mm.  Patient reports sternal chest discomfort attributed to sternotomy. He was just recently evaluated by CT surgery.  -Repeat echo as above.   Hypertension BP today 130/62. -Continue carvedilol and losartan.  Hyperlipidemia/statin intolerance LDL September 2024 110. -Continue Zetia.  Preoperative cardiovascular risk assessment.   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 Chart reviewed as part of pre-operative protocol coverage. According to the RCRI, patient has a 0.9% risk of MACE. Patient reports activity equivalent to 5.07 METS (per DASI).  -Based on ACC/AHA guidelines, MORELAND DEGENOVA would be at a moderate risk for the planned procedure without further cardiovascular testing. He has not updated his echo since June 2023. Given timing of surgery, agree with proceeding without completing echo. Fluids should be limited as much as possible throughout perioperative period.         Disposition: Echo to be scheduled after surgery. Return in 2 months or sooner as needed.          Signed, Etta Grandchild. Aztlan Coll, DNP, NP-C

## 2022-11-30 ENCOUNTER — Ambulatory Visit (HOSPITAL_COMMUNITY): Payer: Self-pay | Admitting: Physician Assistant

## 2022-11-30 ENCOUNTER — Encounter: Payer: Self-pay | Admitting: Student

## 2022-11-30 ENCOUNTER — Encounter (HOSPITAL_COMMUNITY): Payer: Self-pay | Admitting: Urology

## 2022-11-30 ENCOUNTER — Ambulatory Visit (HOSPITAL_COMMUNITY): Payer: Medicare Other

## 2022-11-30 ENCOUNTER — Encounter (HOSPITAL_COMMUNITY)
Admission: RE | Disposition: A | Discharge status: Home / Self Care | Payer: Self-pay | Source: Home / Self Care | Attending: Urology

## 2022-11-30 ENCOUNTER — Ambulatory Visit (HOSPITAL_BASED_OUTPATIENT_CLINIC_OR_DEPARTMENT_OTHER): Payer: Medicare Other | Admitting: Anesthesiology

## 2022-11-30 ENCOUNTER — Observation Stay (HOSPITAL_COMMUNITY)
Admission: RE | Admit: 2022-11-30 | Discharge: 2022-12-01 | Disposition: A | Discharge status: Home / Self Care | Payer: Medicare Other | Attending: Urology | Admitting: Urology

## 2022-11-30 ENCOUNTER — Ambulatory Visit: Payer: Medicare Other | Attending: Student | Admitting: Student

## 2022-11-30 VITALS — BP 130/62 | HR 59 | Ht 74.0 in | Wt 158.0 lb

## 2022-11-30 DIAGNOSIS — C61 Malignant neoplasm of prostate: Secondary | ICD-10-CM | POA: Diagnosis not present

## 2022-11-30 DIAGNOSIS — Z789 Other specified health status: Secondary | ICD-10-CM

## 2022-11-30 DIAGNOSIS — D414 Neoplasm of uncertain behavior of bladder: Secondary | ICD-10-CM

## 2022-11-30 DIAGNOSIS — E782 Mixed hyperlipidemia: Secondary | ICD-10-CM

## 2022-11-30 DIAGNOSIS — Z8679 Personal history of other diseases of the circulatory system: Secondary | ICD-10-CM

## 2022-11-30 DIAGNOSIS — I10 Essential (primary) hypertension: Secondary | ICD-10-CM | POA: Diagnosis not present

## 2022-11-30 DIAGNOSIS — I5022 Chronic systolic (congestive) heart failure: Secondary | ICD-10-CM | POA: Diagnosis not present

## 2022-11-30 DIAGNOSIS — I7121 Aneurysm of the ascending aorta, without rupture: Secondary | ICD-10-CM

## 2022-11-30 DIAGNOSIS — N1330 Unspecified hydronephrosis: Principal | ICD-10-CM | POA: Insufficient documentation

## 2022-11-30 DIAGNOSIS — Z9889 Other specified postprocedural states: Secondary | ICD-10-CM

## 2022-11-30 DIAGNOSIS — Z01810 Encounter for preprocedural cardiovascular examination: Secondary | ICD-10-CM

## 2022-11-30 DIAGNOSIS — Z01818 Encounter for other preprocedural examination: Secondary | ICD-10-CM

## 2022-11-30 DIAGNOSIS — C7951 Secondary malignant neoplasm of bone: Secondary | ICD-10-CM | POA: Insufficient documentation

## 2022-11-30 HISTORY — PX: CYSTOSCOPY W/ URETERAL STENT PLACEMENT: SHX1429

## 2022-11-30 HISTORY — DX: Personal history of other diseases of the circulatory system: Z86.79

## 2022-11-30 HISTORY — PX: TRANSURETHRAL RESECTION OF BLADDER TUMOR: SHX2575

## 2022-11-30 SURGERY — TURBT (TRANSURETHRAL RESECTION OF BLADDER TUMOR)
Anesthesia: General

## 2022-11-30 MED ORDER — SODIUM CHLORIDE 0.9% FLUSH: 3.0000 mL | Freq: Two times a day (BID) | INTRAVENOUS | Status: DC

## 2022-11-30 MED ORDER — ORAL CARE MOUTH RINSE: 15.0000 mL | OROMUCOSAL | Status: DC | PRN

## 2022-11-30 MED ORDER — SODIUM CHLORIDE 0.9 % IR SOLN
Status: DC | PRN
  Administered 2022-11-30 (×8): 3000 mL via INTRAVESICAL

## 2022-11-30 MED ORDER — HYDROMORPHONE HCL 1 MG/ML IJ SOLN
INTRAMUSCULAR | Status: DC | PRN
  Administered 2022-11-30 (×2): .3 mg via INTRAVENOUS

## 2022-11-30 MED ORDER — HYDROMORPHONE HCL 2 MG/ML IJ SOLN
INTRAMUSCULAR | Status: AC
  Filled 2022-11-30: qty 1

## 2022-11-30 MED ORDER — SODIUM CHLORIDE 0.9 % IV SOLN
2.0000 g | Freq: Once | INTRAVENOUS | Status: AC
  Administered 2022-11-30: 2 g via INTRAVENOUS
  Filled 2022-11-30: qty 20

## 2022-11-30 MED ORDER — TRAMADOL HCL 50 MG PO TABS
50.0000 mg | ORAL_TABLET | Freq: Four times a day (QID) | ORAL | Status: DC | PRN
  Administered 2022-11-30 – 2022-12-01 (×2): 50 mg via ORAL
  Filled 2022-11-30 (×2): qty 1

## 2022-11-30 MED ORDER — CHLORHEXIDINE GLUCONATE 0.12 % MT SOLN
15.0000 mL | Freq: Once | OROMUCOSAL | Status: AC
Start: 2022-11-30 — End: 2022-11-30
  Administered 2022-11-30: 15 mL via OROMUCOSAL

## 2022-11-30 MED ORDER — EPHEDRINE 5 MG/ML INJ
INTRAVENOUS | Status: AC
  Filled 2022-11-30: qty 5

## 2022-11-30 MED ORDER — SODIUM CHLORIDE 0.9 % IV SOLN: 250.0000 mL | INTRAVENOUS | Status: DC | PRN

## 2022-11-30 MED ORDER — HYDRALAZINE HCL 20 MG/ML IJ SOLN
INTRAMUSCULAR | Status: AC
  Filled 2022-11-30: qty 1

## 2022-11-30 MED ORDER — SODIUM CHLORIDE 0.9 % IR SOLN
3000.0000 mL | Status: DC
  Administered 2022-11-30 – 2022-12-01 (×5): 3000 mL

## 2022-11-30 MED ORDER — MIDAZOLAM HCL 2 MG/2ML IJ SOLN
INTRAMUSCULAR | Status: AC
  Filled 2022-11-30: qty 2

## 2022-11-30 MED ORDER — LACTATED RINGERS IV SOLN: INTRAVENOUS | Status: DC

## 2022-11-30 MED ORDER — FENTANYL CITRATE (PF) 100 MCG/2ML IJ SOLN
INTRAMUSCULAR | Status: DC | PRN
  Administered 2022-11-30 (×4): 25 ug via INTRAVENOUS

## 2022-11-30 MED ORDER — EZETIMIBE 10 MG PO TABS
10.0000 mg | ORAL_TABLET | Freq: Every day | ORAL | Status: DC
  Administered 2022-12-01: 10 mg via ORAL
  Filled 2022-11-30: qty 1

## 2022-11-30 MED ORDER — FENTANYL CITRATE (PF) 100 MCG/2ML IJ SOLN
INTRAMUSCULAR | Status: AC
  Filled 2022-11-30: qty 2

## 2022-11-30 MED ORDER — FENTANYL CITRATE PF 50 MCG/ML IJ SOSY: 25.0000 ug | PREFILLED_SYRINGE | INTRAMUSCULAR | Status: DC | PRN

## 2022-11-30 MED ORDER — PROPOFOL 500 MG/50ML IV EMUL
INTRAVENOUS | Status: DC | PRN
  Administered 2022-11-30: 75 ug/kg/min via INTRAVENOUS
  Administered 2022-11-30: 90 mg via INTRAVENOUS

## 2022-11-30 MED ORDER — CARVEDILOL 6.25 MG PO TABS
6.2500 mg | ORAL_TABLET | Freq: Two times a day (BID) | ORAL | Status: DC
Start: 2022-11-30 — End: 2022-12-01
  Administered 2022-11-30 – 2022-12-01 (×2): 6.25 mg via ORAL
  Filled 2022-11-30 (×3): qty 1

## 2022-11-30 MED ORDER — HYDRALAZINE HCL 20 MG/ML IJ SOLN
INTRAMUSCULAR | Status: DC | PRN
  Administered 2022-11-30: 5 mg via INTRAVENOUS

## 2022-11-30 MED ORDER — SODIUM CHLORIDE 0.9% FLUSH: 3.0000 mL | INTRAVENOUS | Status: DC | PRN

## 2022-11-30 MED ORDER — DOCUSATE SODIUM 100 MG PO CAPS
100.0000 mg | ORAL_CAPSULE | Freq: Two times a day (BID) | ORAL | Status: DC
Start: 2022-11-30 — End: 2022-12-01
  Administered 2022-11-30 – 2022-12-01 (×2): 100 mg via ORAL
  Filled 2022-11-30 (×2): qty 1

## 2022-11-30 MED ORDER — KETAMINE HCL 10 MG/ML IJ SOLN
INTRAMUSCULAR | Status: DC | PRN
  Administered 2022-11-30: 20 mg via INTRAVENOUS

## 2022-11-30 MED ORDER — ONDANSETRON HCL 4 MG/2ML IJ SOLN: 4.0000 mg | Freq: Once | INTRAMUSCULAR | Status: DC | PRN

## 2022-11-30 MED ORDER — KETAMINE HCL 50 MG/5ML IJ SOSY
PREFILLED_SYRINGE | INTRAMUSCULAR | Status: AC
  Filled 2022-11-30: qty 5

## 2022-11-30 MED ORDER — LIDOCAINE HCL 1 % IJ SOLN
INTRAMUSCULAR | Status: DC | PRN
  Administered 2022-11-30: 80 mg via INTRADERMAL

## 2022-11-30 MED ORDER — MIDAZOLAM HCL 5 MG/5ML IJ SOLN
INTRAMUSCULAR | Status: DC | PRN
  Administered 2022-11-30 (×2): 1 mg via INTRAVENOUS

## 2022-11-30 MED ORDER — ORAL CARE MOUTH RINSE: 15.0000 mL | Freq: Once | OROMUCOSAL | Status: AC

## 2022-11-30 MED ORDER — PROPOFOL 1000 MG/100ML IV EMUL
INTRAVENOUS | Status: AC
  Filled 2022-11-30: qty 100

## 2022-11-30 MED ORDER — ONDANSETRON HCL 4 MG/2ML IJ SOLN
INTRAMUSCULAR | Status: DC | PRN
  Administered 2022-11-30: 4 mg via INTRAVENOUS

## 2022-11-30 MED ORDER — ACETAMINOPHEN 500 MG PO TABS
1000.0000 mg | ORAL_TABLET | Freq: Once | ORAL | Status: AC
  Administered 2022-11-30: 1000 mg via ORAL
  Filled 2022-11-30: qty 2

## 2022-11-30 MED ORDER — DEXAMETHASONE SODIUM PHOSPHATE 10 MG/ML IJ SOLN
INTRAMUSCULAR | Status: DC | PRN
  Administered 2022-11-30: 8 mg via INTRAVENOUS

## 2022-11-30 MED ORDER — LOSARTAN POTASSIUM 50 MG PO TABS: 50.0000 mg | ORAL_TABLET | Freq: Every day | ORAL | Status: DC

## 2022-11-30 MED ORDER — HYDROMORPHONE HCL 1 MG/ML IJ SOLN
0.5000 mg | INTRAMUSCULAR | Status: DC | PRN
  Administered 2022-12-01: 0.5 mg via INTRAVENOUS
  Filled 2022-11-30: qty 0.5

## 2022-11-30 MED ORDER — PHENYLEPHRINE 80 MCG/ML (10ML) SYRINGE FOR IV PUSH (FOR BLOOD PRESSURE SUPPORT)
PREFILLED_SYRINGE | INTRAVENOUS | Status: AC
  Filled 2022-11-30: qty 10

## 2022-11-30 MED ORDER — DONEPEZIL HCL 10 MG PO TABS
10.0000 mg | ORAL_TABLET | Freq: Every day | ORAL | Status: DC
  Administered 2022-12-01: 10 mg via ORAL
  Filled 2022-11-30: qty 1

## 2022-11-30 MED ORDER — LIDOCAINE HCL (PF) 2 % IJ SOLN
INTRAMUSCULAR | Status: AC
  Filled 2022-11-30: qty 5

## 2022-11-30 MED ORDER — OXYBUTYNIN CHLORIDE 5 MG PO TABS
5.0000 mg | ORAL_TABLET | Freq: Three times a day (TID) | ORAL | Status: DC | PRN
Start: 2022-11-30 — End: 2022-12-01

## 2022-11-30 MED ORDER — PROPOFOL 10 MG/ML IV BOLUS
INTRAVENOUS | Status: DC | PRN
  Administered 2022-11-30: 75 ug/kg/min via INTRAVENOUS
  Administered 2022-11-30: 90 mg via INTRAVENOUS

## 2022-11-30 SURGICAL SUPPLY — 21 items
BAG URINE DRAIN 2000ML AR STRL (UROLOGICAL SUPPLIES) IMPLANT
BAG URO CATCHER STRL LF (MISCELLANEOUS) ×2 IMPLANT
BAND RUBBER #18 3X1/16 STRL (MISCELLANEOUS) IMPLANT
BASKET ZERO TIP NITINOL 2.4FR (BASKET) IMPLANT
CATH FOLEY 2WAY SLVR 5CC 20FR (CATHETERS) IMPLANT
CATH HEMA 3WAY 30CC 22FR COUDE (CATHETERS) IMPLANT
CATH URETL OPEN END 6FR 70 (CATHETERS) IMPLANT
CLOTH BEACON ORANGE TIMEOUT ST (SAFETY) ×2 IMPLANT
DRAPE FOOT SWITCH (DRAPES) ×2 IMPLANT
GLOVE SURG LX STRL 7.5 STRW (GLOVE) ×2 IMPLANT
GOWN STRL REUS W/ TWL XL LVL3 (GOWN DISPOSABLE) ×2 IMPLANT
GOWN STRL REUS W/TWL XL LVL3 (GOWN DISPOSABLE) ×2
GUIDEWIRE ANG ZIPWIRE 038X150 (WIRE) IMPLANT
GUIDEWIRE STR DUAL SENSOR (WIRE) ×2 IMPLANT
KIT TURNOVER KIT A (KITS) IMPLANT
LOOP CUT BIPOLAR 24F LRG (ELECTROSURGICAL) IMPLANT
MANIFOLD NEPTUNE II (INSTRUMENTS) ×2 IMPLANT
PACK CYSTO (CUSTOM PROCEDURE TRAY) ×2 IMPLANT
SYR 30ML LL (SYRINGE) IMPLANT
TUBING CONNECTING 10 (TUBING) ×2 IMPLANT
TUBING UROLOGY SET (TUBING) ×2 IMPLANT

## 2022-11-30 NOTE — Transfer of Care (Signed)
Immediate Anesthesia Transfer of Care Note  Patient: Justin Contreras  Procedure(s) Performed: TRANSURETHRAL RESECTION OF BLADDER TUMOR (TURBT) AND TRANSURETHRAL RESECTION OF PROSTATE (TURP) CYSTOSCOPY (Bilateral)  Patient Location: PACU  Anesthesia Type:General  Level of Consciousness: awake, alert , and oriented  Airway & Oxygen Therapy: Patient Spontanous Breathing and Patient connected to face mask oxygen  Post-op Assessment: Report given to RN and Post -op Vital signs reviewed and stable  Post vital signs: Reviewed and stable  Last Vitals:  Vitals Value Taken Time  BP 173/73 11/30/22 1716  Temp 36.7 C 11/30/22 1716  Pulse 71 11/30/22 1717  Resp 13 11/30/22 1717  SpO2 100 % 11/30/22 1717  Vitals shown include unfiled device data.  Last Pain:  Vitals:   11/30/22 1340  TempSrc: Oral  PainSc:          Complications: No notable events documented.

## 2022-11-30 NOTE — Anesthesia Preprocedure Evaluation (Addendum)
Anesthesia Evaluation  Patient identified by MRN, date of birth, ID band Patient awake    Reviewed: Allergy & Precautions, NPO status , Patient's Chart, lab work & pertinent test results, reviewed documented beta blocker date and time   Airway Mallampati: II  TM Distance: >3 FB Neck ROM: Full    Dental  (+) Teeth Intact, Dental Advisory Given   Pulmonary COPD, former smoker   Pulmonary exam normal breath sounds clear to auscultation       Cardiovascular hypertension, Pt. on home beta blockers and Pt. on medications (-) angina (-) Past MI Normal cardiovascular exam+ dysrhythmias (LBBB) Atrial Fibrillation  Rhythm:Regular Rate:Normal  Echo June 2023 showed EF 35 to 40%, global hypokinesis, moderate LVH, Grade I DD  Ascending thoracic aortic aneurysm s/p repair October 2022    Neuro/Psych  Headaches PSYCHIATRIC DISORDERS Anxiety Depression   Dementia    GI/Hepatic negative GI ROS, Neg liver ROS,,,  Endo/Other  negative endocrine ROS    Renal/GU negative Renal ROS     Musculoskeletal  (+) Arthritis ,    Abdominal   Peds  Hematology negative hematology ROS (+)   Anesthesia Other Findings Day of surgery medications reviewed with the patient.  Reproductive/Obstetrics                             Anesthesia Physical Anesthesia Plan  ASA: 3  Anesthesia Plan: General   Post-op Pain Management: Tylenol PO (pre-op)*   Induction: Intravenous  PONV Risk Score and Plan: 3 and Dexamethasone and Ondansetron  Airway Management Planned: LMA and Oral ETT  Additional Equipment:   Intra-op Plan:   Post-operative Plan: Extubation in OR  Informed Consent: I have reviewed the patients History and Physical, chart, labs and discussed the procedure including the risks, benefits and alternatives for the proposed anesthesia with the patient or authorized representative who has indicated his/her  understanding and acceptance.     Dental advisory given  Plan Discussed with: CRNA  Anesthesia Plan Comments: (Etomidate for induction)        Anesthesia Quick Evaluation

## 2022-11-30 NOTE — Anesthesia Postprocedure Evaluation (Signed)
Anesthesia Post Note  Patient: Justin Contreras  Procedure(s) Performed: TRANSURETHRAL RESECTION OF BLADDER TUMOR (TURBT) AND TRANSURETHRAL RESECTION OF PROSTATE (TURP) CYSTOSCOPY (Bilateral)     Patient location during evaluation: PACU Anesthesia Type: General Level of consciousness: awake and alert Pain management: pain level controlled Vital Signs Assessment: post-procedure vital signs reviewed and stable Respiratory status: spontaneous breathing, nonlabored ventilation, respiratory function stable and patient connected to nasal cannula oxygen Cardiovascular status: blood pressure returned to baseline and stable Postop Assessment: no apparent nausea or vomiting Anesthetic complications: no   No notable events documented.  Last Vitals:  Vitals:   11/30/22 1800 11/30/22 1839  BP: (!) 154/62 (!) 150/60  Pulse: 60 63  Resp: 12 16  Temp: 36.6 C 36.6 C  SpO2: 96% 93%    Last Pain:  Vitals:   11/30/22 2013  TempSrc:   PainSc: 0-No pain                 Collene Schlichter

## 2022-11-30 NOTE — Anesthesia Procedure Notes (Signed)
Procedure Name: LMA Insertion Date/Time: 11/30/2022 3:53 PM  Performed by: Floydene Flock, CRNAPre-anesthesia Checklist: Patient identified, Emergency Drugs available, Suction available, Patient being monitored and Timeout performed Patient Re-evaluated:Patient Re-evaluated prior to induction Oxygen Delivery Method: Circle system utilized Preoxygenation: Pre-oxygenation with 100% oxygen Induction Type: IV induction LMA: LMA inserted LMA Size: 4.0 Tube secured with: Tape Dental Injury: Teeth and Oropharynx as per pre-operative assessment

## 2022-11-30 NOTE — Patient Instructions (Signed)
Medication Instructions:  Your physician recommends that you continue on your current medications as directed. Please refer to the Current Medication list given to you today.  *If you need a refill on your cardiac medications before your next appointment, please call your pharmacy*  Lab Work: - None ordered  Testing/Procedures: - None ordered  Follow-Up: At [facility], you and your health needs are our priority.  As part of our continuing mission to provide you with exceptional heart care, we have created designated Provider Care Teams.  These Care Teams include your primary Cardiologist (physician) and Advanced Practice Providers (APPs -  Physician Assistants and Nurse Practitioners) who all work together to provide you with the care you need, when you need it.  Your next appointment:   2 month(s)  Provider:   Yvonne Kendall, MD or Carlos Levering, NP    Other Instructions - Schedule echocardiogram

## 2022-11-30 NOTE — Op Note (Signed)
Preoperative diagnosis: Bladder neoplasm of uncertain malignant potential, bilateral hydronephrosis  Postoperative diagnosis: Same  Procedure: TURBT greater than 5 cm  Surgeon: Mena Goes  Anesthesia: General  Indication for procedure: Justin Contreras is an 82 year old male with a history of castrate resistant prostate cancer who developed right hydronephrosis on a chest CT scan.  Follow-up CT of the abdomen and pelvis revealed a 5 cm bladder mass along the trigone posterior bladder and bilateral hydroureteronephrosis.  Findings: On cystoscopy the prostatic urethra was covered in the dense papillary tumor which extended along the bladder neck bilaterally and out into the trigone and bladder bilaterally and posteriorly.  Description of procedure: After consent was obtained patient was brought to the operating room.  After adequate anesthesia he was placed in lithotomy position and prepped and draped in the usual sterile fashion.  Timeout was performed to confirm the patient and procedure.  The cystoscope was passed per urethra and although the prostatic urethra was quite tight the cystoscope was able to split it apart and I was able to find the bladder relatively easily.  There is papillary tumor coating the prostatic urethra and extending into the bladder and over the trigone.  It almost appeared like bullous edema.  I began resecting at the bladder neck about 6:00 and took that up on the left side and it became evident tumor extended out into the bladder similar to an intravesical extension of the prostate.  I then took this resection up the right side of the bladder neck and resected papillary tumor.  It became evident this only involved tumor along the bladder neck but the trigone was completely covered in a difference broad-based papillary tumor.  I then started from the right side posteriorly and resected over to the left side.  Similar to the CT it extended out into the bladder and a good 5 cm so I was not  able to get completely behind the tumor and resected down to the bladder wall.  I was able to resect a good bit of tumor from right to left all the way across the posterior bladder to create some space and provide some hemostasis.  All the chips were evacuated.  Hemostasis was reasonable at lower pressure but as expected from highly papillary tumor there were several spots of some mild bleeding and therefore I thought it was best to leave a hematuria catheter on CBI for wake-up.  A 22 French catheter was placed balloon inflated to 20 cc and seated at the bladder neck.  It was connected to CBI.  Drainage was grade 2.  He was awakened taken the cover room in stable condition.  Complications: None  Blood loss: 50 mL  Specimens to pathology: TURP and TURBT chips  Drains: 22 French three-way hematuria catheter  Disposition: Patient stable to PACU-I called and discussed the patient with his wife and son.  I discussed the advanced nature of a prostate or bladder cancer.  We discussed possible treatment such as chemotherapy or major surgery which are going to be difficult for the patient.  His oncologist had already withheld chemotherapy comorbidities. We discussed he may need bilateral nephrostomy tubes as his creatinine is rising and the trigone and ureteral orifice ease could not be visualized due to the extensive locally invasive tumor.  This is not a curative but palliative intervention. Will plan to check his kidney function in the morning and then make yeah a final decision as they consider some of the long-term options.

## 2022-11-30 NOTE — Interval H&P Note (Signed)
History and Physical Interval Note:  11/30/2022 3:17 PM  Justin Contreras  has presented today for surgery, with the diagnosis of PROSTATE CANCER BILATERAL HYDRONEPHROSIS.  The various methods of treatment have been discussed with the patient and family. After consideration of risks, benefits and other options for treatment, the patient has consented to  Procedure(s) with comments: TRANSURETHRAL RESECTION OF BLADDER TUMOR (TURBT) (N/A) CYSTOSCOPY WITH BILATERAL RETROGRADE PYELOGRAM/URETERAL STENT PLACEMENT (Bilateral) - 75 MINS FOR CASE as a surgical intervention.  Discussed poss failure to get bladder access - prostatic urethra is contracted. Otherwise he looks and feels well. No fever. The patient's history has been reviewed, patient examined, no change in status, stable for surgery.  I have reviewed the patient's chart and labs.  Questions were answered to the patient's satisfaction.     Jerilee Field

## 2022-12-01 ENCOUNTER — Encounter (HOSPITAL_COMMUNITY): Payer: Self-pay | Admitting: Urology

## 2022-12-01 DIAGNOSIS — N1330 Unspecified hydronephrosis: Secondary | ICD-10-CM | POA: Diagnosis not present

## 2022-12-01 LAB — BASIC METABOLIC PANEL
Anion gap: 7 (ref 5–15)
BUN: 25 mg/dL — ABNORMAL HIGH (ref 8–23)
CO2: 27 mmol/L (ref 22–32)
Calcium: 7.3 mg/dL — ABNORMAL LOW (ref 8.9–10.3)
Chloride: 105 mmol/L (ref 98–111)
Creatinine, Ser: 1.39 mg/dL — ABNORMAL HIGH (ref 0.61–1.24)
GFR, Estimated: 51 mL/min — ABNORMAL LOW (ref >60)
Glucose, Bld: 98 mg/dL (ref 70–99)
Potassium: 4.1 mmol/L (ref 3.5–5.1)
Sodium: 139 mmol/L (ref 135–145)

## 2022-12-01 LAB — HEMOGLOBIN AND HEMATOCRIT, BLOOD
HCT: 30.9 % — ABNORMAL LOW (ref 39.0–52.0)
Hemoglobin: 10.2 g/dL — ABNORMAL LOW (ref 13.0–17.0)

## 2022-12-01 MED ORDER — OXYCODONE-ACETAMINOPHEN 5-325 MG PO TABS: 1.0000 | ORAL_TABLET | Freq: Four times a day (QID) | ORAL | 0 refills | Status: AC | PRN

## 2022-12-01 NOTE — TOC Transition Note (Signed)
Transition of Care [facility]) - CM/SW Discharge Note   Patient Details  Name: Justin Contreras MRN: [id number] Date of Birth: [birth year]  Transition of Care [facility]) CM/SW Contact:  Lanier Clam, RN Phone Number: 12/01/2022, 12:07 PM   Clinical Narrative:  d/c home no needs or orders.     Final next level of care: Home/Self Care Barriers to Discharge: No Barriers Identified   Patient Goals and CMS Choice CMS Medicare.gov Compare Post Acute Care list provided to:: Patient Choice offered to / list presented to : Patient  Discharge Placement                         Discharge Plan and Services Additional resources added to the After Visit Summary for     Discharge Planning Services: CM Consult                                 Social Determinants of Health (SDOH) Interventions SDOH Screenings   Food Insecurity: No Food Insecurity (11/30/2022)  Housing: Low Risk  (11/30/2022)  Transportation Needs: No Transportation Needs (11/30/2022)  Utilities: Not At Risk (11/30/2022)  Tobacco Use: Medium Risk (11/30/2022)     Readmission Risk Interventions     No data to display

## 2022-12-01 NOTE — Discharge Summary (Signed)
Physician Discharge Summary  Patient ID: Justin Contreras MRN: [id number] DOB/AGE: July 29, 1940 82 y.o.  Admit date: 11/30/2022 Discharge date: 12/01/2022  Admission Diagnoses:  Discharge Diagnoses:  Principal Problem:   Bilateral hydronephrosis   Discharged Condition: fair  Hospital Course: Stclair was admitted following TURBT for CBI and monitoring of his kidney function.  He has done well.  Urine was clear this morning on a light CBI and CBI was discontinued. His urine remained light and the foley was removed.   As far as the prostate/bladder mass-it seems to be locally invasive and there is a new right iliac node on CT.  Depending on path we discussed this may be chemotherapy or radical cystoprostatectomy.  Therefore they will set up a appointment with Dr. Allison Quarry to discuss options.  They are also pursuing palliative care through his PCP.  His ECOG is around 2 now with comorbidities including cardiac and memory issues.    His bladder mass emanating what looks to be from his prostatic urethra or prostate invading the bladder neck, trigone and posterior bladder is causing bilateral hydronephrosis.  His hemoglobin was reasonable this morning at 10.2 and creatinine stable at 1.39.  I had a long discussion with Greig Castilla and his mom and again with Greig Castilla this morning.  Will take a more palliative approach on his kidneys.  If his kidney function severely worsens, fever pain etc. will proceed with the nephrostomy tubes.  For now we will hold off.  We went over extensively nature risk benefits and alternatives to the nephrostomy tubes.  They understand this is another life-threatening component.  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 No prior LAD but new LAD on CT. PSA risen to an 1.95 on Xtandi and ADT. Dr. Allison Quarry had not recommended chemotherapy for his prostate cancer.    I had a long discussion again with Greig Castilla with return precautions discussed.    Consults: None  Significant Diagnostic Studies: none  Treatments: surgery: TURP/TURBT > 5 cm    Discharge Exam: Blood pressure (!) 118/53, pulse (!) 57, temperature 97.8 F (36.6 C), temperature source Oral, resp. rate 17, height 6\' 2"  (1.88 m), weight 71.7 kg, SpO2 95%. Looks well - alert and oriented  No focal deficits  CV - RRR Resp - reg effort and depth  Abd - soft, NT Ext - no calf pain or swelling.  GU - foley in place. Urine clear on slow cbi gtt.   Disposition: To home    Allergies as of 12/01/2022       Reactions   Amoxicillin Other (See Comments)   Bad taste in mouth & tongue gets fuzzy feeling   Statins Other (See Comments)   Oral problems/bad taste and fatigue        Medication List     TAKE these medications    B COMPLEX PO Take 1 capsule by mouth daily with breakfast.   CALCIUM + D3 PO Take 1 capsule by mouth daily with breakfast.   carvedilol 6.25 MG tablet Commonly known as: COREG TAKE ONE TABLET BY MOUTH TWICE A DAY   cephALEXin 250 MG capsule Commonly known as: KEFLEX Take 250 mg by mouth at bedtime.   donepezil 10 MG tablet Commonly known as: ARICEPT Take 10 mg by mouth daily.   escitalopram 5 MG tablet Commonly known as: LEXAPRO Take 5 mg by mouth daily.   ezetimibe 10 MG tablet Commonly known as: ZETIA Take 1 tablet (10  mg total) by mouth daily.   losartan 100 MG tablet Commonly known as: COZAAR Take 100 mg by mouth daily.   oxybutynin 5 MG tablet Commonly known as: DITROPAN Take 5 mg by mouth daily.   oxyCODONE-acetaminophen 5-325 MG tablet Commonly known as: PERCOCET/ROXICET Take 1 tablet by mouth every 6 (six) hours as needed (for pain).   phenazopyridine 200 MG tablet Commonly known as: PYRIDIUM Take 200 mg by mouth 3 (three) times daily as needed for pain.   silodosin 4 MG Caps capsule Commonly known as: RAPAFLO Take 4 mg by mouth at bedtime.   SYSTANE OP Place 1 drop into both eyes 4 (four) times daily  as needed (for dryness).   Xtandi 40 MG tablet Generic drug: enzalutamide Take 160 mg by mouth at bedtime.        Follow-up Information     Jerilee Field, MD Follow up on 12/07/2022.   Specialty: Urology Why: at 9:30 AM with Nurse Lauren for bladder check. You do not need to keep this appointment if you are doing OK. Lawanna Kobus, my prostate cancer nurse, will call for an appointment with me. Contact information: [address] [place] [address] [phone]                 Signed: Jerilee Field 12/01/2022

## 2022-12-01 NOTE — Plan of Care (Signed)
  Problem: Education: Goal: Knowledge of General Education information will improve Description: Including pain rating scale, medication(s)/side effects and non-pharmacologic comfort measures Outcome: Progressing   Problem: Health Behavior/Discharge Planning: Goal: Ability to manage health-related needs will improve Outcome: Progressing   Problem: Clinical Measurements: Goal: Ability to maintain clinical measurements within normal limits will improve Outcome: Progressing Goal: Will remain free from infection Outcome: Progressing Goal: Diagnostic test results will improve Outcome: Progressing Goal: Respiratory complications will improve Outcome: Progressing Goal: Cardiovascular complication will be avoided Outcome: Progressing   Problem: Activity: Goal: Risk for activity intolerance will decrease Outcome: Progressing   Problem: Nutrition: Goal: Adequate nutrition will be maintained Outcome: Progressing   Problem: Coping: Goal: Level of anxiety will decrease Outcome: Progressing   Problem: Elimination: Goal: Will not experience complications related to bowel motility Outcome: Progressing Goal: Will not experience complications related to urinary retention Outcome: Progressing   Problem: Pain Management: Goal: General experience of comfort will improve Outcome: Progressing   Problem: Safety: Goal: Ability to remain free from injury will improve Outcome: Progressing   Problem: Skin Integrity: Goal: Risk for impaired skin integrity will decrease Outcome: Progressing   Problem: Education: Goal: Knowledge of the prescribed therapeutic regimen will improve Outcome: Progressing   Problem: Bowel/Gastric: Goal: Gastrointestinal status for postoperative course will improve Outcome: Progressing   Problem: Health Behavior/Discharge Planning: Goal: Identification of resources available to assist in meeting health care needs will improve Outcome: Progressing   Problem:  Skin Integrity: Goal: Demonstration of wound healing without infection will improve Outcome: Progressing   Problem: Urinary Elimination: Goal: Ability to avoid or minimize complications of infection will improve Outcome: Progressing

## 2022-12-01 NOTE — Plan of Care (Signed)
  Problem: Nutrition: Goal: Adequate nutrition will be maintained Outcome: Progressing   Problem: Pain Management: Goal: General experience of comfort will improve Outcome: Progressing   Problem: Safety: Goal: Ability to remain free from injury will improve Outcome: Progressing   Problem: Bowel/Gastric: Goal: Gastrointestinal status for postoperative course will improve Outcome: Progressing

## 2022-12-02 LAB — SURGICAL PATHOLOGY

## 2022-12-22 ENCOUNTER — Ambulatory Visit: Payer: Medicare Other | Attending: Student

## 2022-12-22 DIAGNOSIS — I5022 Chronic systolic (congestive) heart failure: Secondary | ICD-10-CM | POA: Diagnosis not present

## 2022-12-23 LAB — ECHOCARDIOGRAM COMPLETE
Area-P 1/2: 2.69 cm2
S' Lateral: 4.3 cm

## 2022-12-27 ENCOUNTER — Encounter: Payer: Self-pay | Admitting: Emergency Medicine

## 2023-01-29 NOTE — Progress Notes (Deleted)
Cardiology Clinic Note   Date: 01/29/2023 ID: ESTER LINNEMAN, DOB 07/10/1940, MRN [id number]  Primary Cardiologist:  Yvonne Kendall, MD  Patient Profile    HENOK DELOZIER is a 83 y.o. male who presents to the clinic today for ***    Past medical history significant for: Chronic HFrEF/nonischemic cardiomyopathy. R/LHC 10/20/2020: No angiographically significant CAD.  Normal left heart, right heart, and pulmonary pressures.  Normal Fick cardiac output/index. Echo 12/22/2022: EF 35 to 40%.  No RWMA.  Moderate LVH.  Grade I DD.  Normal RV size/function.  Mild LAE.  Moderate MR.  Mild AI.  Aortic root is normal in size and structure. PAF. 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 CTA chest aorta 11/05/2022: Stable postoperative changes of a prior repair of ascending thoracic aortic aneurysm without evidence of complication. Hypertension. Hyperlipidemia. Lipid panel 10/07/2022: LDL 107, HDL 67, TG 80, total 194. MRSA CVA. COPD.   Prostate cancer with bone mets. Bronchiectasis. S/p left lung resection. Alzheimer disease.  In summary, patient underwent PET/CT in mid 2022 in the setting of elevated PSA and prior history of prostate cancer which showed recurrence of prostate cancer and incidental finding of ascending thoracic aortic aneurysm.  Follow-up CTA chest showed 58 mm ascending thoracic aortic aneurysm.  He was seen by CT surgery and underwent echo which showed EF 45% with global hypokinesis and Grade I DD.  He was experiencing dyspnea with exertion and was referred to Dr. Okey Dupre.  He underwent R/LHC as above.  He underwent ascending aortic and proximal arch aneurysm repair (details above).  His postoperative course was complicated by A-fib requiring initiation of amiodarone with subsequent conversion to sinus rhythm.  Postop echo showed EF 35 to 40% with global hypokinesis, trivial pericardial effusion, and mild aortic root dilatation 39 mm.   July 2024 he was seen in follow-up and complained of increased DOE while performing yard work.  He also noted occasional chest discomfort while outside in the heat that resolves when he returns inside.  Symptoms did not limit his activity.  Plan for repeat echo.  Patient was seen in the office for hospital follow-up in August 2024.  During his hospital workup BNP was elevated at 611.  Troponin was negative.  Chest x-ray with no acute findings.  Cardiology was consulted and patient was started on low-dose diuretic.  At the time of his follow-up he reported stable chronic dyspnea related to the heat and intermittent nonanginal chest pain.  An echo was ordered but never completed.     History of Present Illness    TIAGO ROTHBERG is followed by Dr. Okey Dupre for the above outlined history.  Patient was last seen in the office by me on 11/30/2022 for preoperative evaluation prior to bladder tumor resection that was taking place that afternoon.  Patient had been dealing with several health problems over the prior few months in relation to anemia, prostate cancer with bone mets, kidney/bladder dysfunction related to the prostate cancer.  He complained of sternal pain related to prior sternotomy but no anginal chest pain pressure or tightness.  Patient was independent with ADLs and completing light to moderate household activities and navigating stairs in his home several times a day without difficulty.  He was deemed an acceptable risk for surgery with plan to undergo repeat echo after surgery.  Patient had repeat echo December 2024 which showed moderately reduced LV function as detailed above.  Today, patient ***  Chronic HFrEF/nonischemic cardiomyopathy  [facility] October 2022 showed no angiographically significant CAD and normal heart pressures.  Echo December 2024 showed EF 35 to 40%, no RWMA, moderate LVH, Grade I DD, moderate MR, mild AI.  Patient*** Euvolemic and well compensated on exam.  -Continue losartan,  carvedilol. -Cannot change to ARNI secondary to soft BP.*** -Not a candidate for SGLT2i secondary to prostate cancer.   Ascending thoracic aortic aneurysm S/p repair October 2022.  CTA chest aorta October 2024 showed stable postoperative changes of prior repair of ascending thoracic aortic aneurysm without evidence of complication.  Echo December 2024 showed normal size and structure of aortic root.  Patient reports sternal chest discomfort attributed to sternotomy.*** -Repeat testing as clinically indicated.   Hypertension BP today***. -Continue carvedilol and losartan.   Hyperlipidemia/statin intolerance LDL September 2024 110. -Continue Zetia.   ROS: All other systems reviewed and are otherwise negative except as noted in History of Present Illness.  EKGs/Labs Reviewed        12/01/2022: BUN 25; Creatinine, Ser 1.39; Potassium 4.1; Sodium 139   11/29/2022: WBC 4.0 12/01/2022: Hemoglobin 10.2   No results found for requested labs within last 365 days.   No results found for requested labs within last 365 days.  ***  Risk Assessment/Calculations    {Does this patient have ATRIAL FIBRILLATION?[phone]} No BP recorded.  {Refresh Note OR Click here to enter BP  :1}***        Physical Exam    VS:  There were no vitals taken for this visit. , BMI There is no height or weight on file to calculate BMI.  GEN: Well nourished, well developed, in no acute distress. Neck: No JVD or carotid bruits. Cardiac: *** RRR. No murmurs. No rubs or gallops.   Respiratory:  Respirations regular and unlabored. Clear to auscultation without rales, wheezing or rhonchi. GI: Soft, nontender, nondistended. Extremities: Radials/DP/PT 2+ and equal bilaterally. No clubbing or cyanosis. No edema ***  Skin: Warm and dry, no rash. Neuro: Strength intact.  Assessment & Plan   ***  Disposition: ***     {Are you ordering a CV Procedure (e.g. stress test, cath, DCCV, TEE, etc)?   Press F2         :[id number]}   Signed, Etta Grandchild. Sherlynn Tourville, DNP, NP-C

## 2023-01-31 ENCOUNTER — Ambulatory Visit: Payer: Medicare Other | Admitting: Student

## 2023-02-12 DEATH — deceased
